# Patient Record
Sex: Female | Born: 1990 | Race: White | Hispanic: No | Marital: Married | Smoking: Never smoker
Health system: Southern US, Community
[De-identification: ages and names within clinical notes are randomized; demographics above are authoritative.]

## PROBLEM LIST (undated history)

## (undated) HISTORY — PX: WISDOM TOOTH EXTRACTION: SHX21

---
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
## 2018-12-02 ENCOUNTER — Other Ambulatory Visit: Payer: Self-pay | Admitting: *Deleted

## 2018-12-02 DIAGNOSIS — Z20822 Contact with and (suspected) exposure to covid-19: Secondary | ICD-10-CM

## 2018-12-04 LAB — NOVEL CORONAVIRUS, NAA: SARS-CoV-2, NAA: NOT DETECTED

## 2019-03-05 ENCOUNTER — Ambulatory Visit: Payer: Self-pay | Admitting: Obstetrics and Gynecology

## 2019-03-24 ENCOUNTER — Other Ambulatory Visit: Payer: Self-pay

## 2019-03-24 ENCOUNTER — Encounter: Payer: Self-pay | Admitting: Obstetrics and Gynecology

## 2019-03-24 ENCOUNTER — Other Ambulatory Visit (HOSPITAL_COMMUNITY)
Admission: RE | Admit: 2019-03-24 | Discharge: 2019-03-24 | Disposition: A | Discharge status: Home / Self Care | Payer: 59 | Source: Ambulatory Visit | Attending: Obstetrics and Gynecology | Admitting: Obstetrics and Gynecology

## 2019-03-24 ENCOUNTER — Ambulatory Visit (INDEPENDENT_AMBULATORY_CARE_PROVIDER_SITE_OTHER): Payer: 59 | Admitting: Obstetrics and Gynecology

## 2019-03-24 VITALS — BP 120/82 | Ht 63.0 in | Wt 167.0 lb

## 2019-03-24 DIAGNOSIS — Z113 Encounter for screening for infections with a predominantly sexual mode of transmission: Secondary | ICD-10-CM | POA: Insufficient documentation

## 2019-03-24 DIAGNOSIS — E88819 Insulin resistance, unspecified: Secondary | ICD-10-CM

## 2019-03-24 DIAGNOSIS — Z130 Encounter for screening for diseases of the blood and blood-forming organs and certain disorders involving the immune mechanism: Secondary | ICD-10-CM

## 2019-03-24 DIAGNOSIS — Z124 Encounter for screening for malignant neoplasm of cervix: Secondary | ICD-10-CM | POA: Diagnosis present

## 2019-03-24 DIAGNOSIS — R7989 Other specified abnormal findings of blood chemistry: Secondary | ICD-10-CM | POA: Diagnosis not present

## 2019-03-24 DIAGNOSIS — E8881 Metabolic syndrome: Secondary | ICD-10-CM | POA: Diagnosis not present

## 2019-03-24 DIAGNOSIS — Z01411 Encounter for gynecological examination (general) (routine) with abnormal findings: Secondary | ICD-10-CM | POA: Diagnosis not present

## 2019-03-24 DIAGNOSIS — Z0000 Encounter for general adult medical examination without abnormal findings: Secondary | ICD-10-CM

## 2019-03-24 DIAGNOSIS — Z1329 Encounter for screening for other suspected endocrine disorder: Secondary | ICD-10-CM

## 2019-03-24 DIAGNOSIS — Z23 Encounter for immunization: Secondary | ICD-10-CM | POA: Diagnosis not present

## 2019-03-24 NOTE — Progress Notes (Signed)
Gynecology Annual Exam   PCP: Patient, No Pcp Per  Chief Complaint:  Chief Complaint  Patient presents with  . Gynecologic Exam    History of Present Illness: Patient is a 29 y.o. G0P0000 presents for annual exam. The patient has no complaints today.   LMP: Patient's last menstrual period was 02/20/2019 (exact date). Average Interval: regular, 28 days Duration of flow: 7 days Moderate to heavy blood flow Clots: no Intermenstrual Bleeding: no Postcoital Bleeding: no Dysmenorrhea: no  The patient is sexually active. She currently uses none for contraception. She denies dyspareunia.  The patient does perform self breast exams.  There is no notable family history of breast or ovarian cancer in her family.  The patient wears seatbelts: no.   The patient has regular exercise: yes.    The patient denies current symptoms of depression.    She reports that in in February March 2020 she gained 50 pounds without explanation or change in diet and amount of exercise.  In November 2020 she had blood work which was suggestive of PCOS.  She says that she her testosterone was elevated.  She had not had a pelvic ultrasound.  She is not having irregular menstrual cycles.  She was started on Trulicity which help her with losing weight.  She reports she has lost about 17 pounds since using Trulicity.  She stopped this medication about a month ago.  She denies acne or symptoms of excessive hair growth.   Review of Systems: Review of Systems  Constitutional: Negative for chills, fever, malaise/fatigue and weight loss.  HENT: Negative for congestion, hearing loss and sinus pain.   Eyes: Negative for blurred vision and double vision.  Respiratory: Negative for cough, sputum production, shortness of breath and wheezing.   Cardiovascular: Negative for chest pain, palpitations, orthopnea and leg swelling.  Gastrointestinal: Negative for abdominal pain, constipation, diarrhea, nausea and vomiting.    Genitourinary: Negative for dysuria, flank pain, frequency, hematuria and urgency.  Musculoskeletal: Negative for back pain, falls and joint pain.  Skin: Negative for itching and rash.  Neurological: Negative for dizziness and headaches.  Psychiatric/Behavioral: Negative for depression, substance abuse and suicidal ideas. The patient is not nervous/anxious.     Past Medical History:  There are no problems to display for this patient.   Past Surgical History:  History reviewed. No pertinent surgical history.  Gynecologic History:  Patient's last menstrual period was 02/20/2019 (exact date). Contraception: none Last Pap: Results were: 2015, see care everywhere   Obstetric History: G0P0000  Family History:  Family History  Problem Relation Age of Onset  . Bladder Cancer Father     Social History:  Social History   Socioeconomic History  . Marital status: Married    Spouse name: Not on file  . Number of children: Not on file  . Years of education: Not on file  . Highest education level: Not on file  Occupational History  . Not on file  Tobacco Use  . Smoking status: Never Smoker  . Smokeless tobacco: Never Used  Substance and Sexual Activity  . Alcohol use: Not Currently  . Drug use: Never  . Sexual activity: Yes    Birth control/protection: None  Other Topics Concern  . Not on file  Social History Narrative  . Not on file   Social Determinants of Health   Financial Resource Strain:   . Difficulty of Paying Living Expenses: Not on file  Food Insecurity:   . Worried About  Running Out of Food in the Last Year: Not on file  . Ran Out of Food in the Last Year: Not on file  Transportation Needs:   . Lack of Transportation (Medical): Not on file  . Lack of Transportation (Non-Medical): Not on file  Physical Activity:   . Days of Exercise per Week: Not on file  . Minutes of Exercise per Session: Not on file  Stress:   . Feeling of Stress : Not on file  Social  Connections:   . Frequency of Communication with Friends and Family: Not on file  . Frequency of Social Gatherings with Friends and Family: Not on file  . Attends Religious Services: Not on file  . Active Member of Clubs or Organizations: Not on file  . Attends Archivist Meetings: Not on file  . Marital Status: Not on file  Intimate Partner Violence:   . Fear of Current or Ex-Partner: Not on file  . Emotionally Abused: Not on file  . Physically Abused: Not on file  . Sexually Abused: Not on file    Allergies:  No Known Allergies  Medications: Prior to Admission medications   Not on File    Physical Exam Vitals: Blood pressure 120/82, height 5\' 3"  (1.6 m), weight 167 lb (75.8 kg), last menstrual period 02/20/2019.  General: NAD HEENT: normocephalic, anicteric Thyroid: no enlargement, no palpable nodules Pulmonary: No increased work of breathing, CTAB Cardiovascular: RRR, distal pulses 2+ Breast: Breast symmetrical, no tenderness, no palpable nodules or masses, no skin or nipple retraction present, no nipple discharge.  No axillary or supraclavicular lymphadenopathy. Abdomen: NABS, soft, non-tender, non-distended.  Umbilicus without lesions.  No hepatomegaly, splenomegaly or masses palpable. No evidence of hernia  Genitourinary:  External: Normal external female genitalia.  Normal urethral meatus, normal Bartholin's and Skene's glands.    Vagina: Normal vaginal mucosa, no evidence of prolapse.    Cervix: Grossly normal in appearance, no bleeding  Uterus: Non-enlarged, mobile, normal contour.  No CMT  Adnexa: ovaries non-enlarged, no adnexal masses  Rectal: deferred  Lymphatic: no evidence of inguinal lymphadenopathy Extremities: no edema, erythema, or tenderness Neurologic: Grossly intact Psychiatric: mood appropriate, affect full  Female chaperone present for pelvic and breast  portions of the physical exam   Assessment: 29 y.o. G0P0000 routine annual  exam  Plan: Problem List Items Addressed This Visit    None    Visit Diagnoses    Healthcare maintenance    -  Primary   Relevant Orders   CBC   TSH + free T4   Testosterone, Free, Total, SHBG   Hemoglobin A1c   Cervical cancer screening       Relevant Orders   Cytology - PAP   Screening examination for STD (sexually transmitted disease)       Relevant Orders   Cytology - PAP   Screening, anemia, deficiency, iron       Relevant Orders   CBC   Screening for thyroid disorder       Relevant Orders   Testosterone, Free, Total, SHBG   Elevated testosterone level       Relevant Orders   Testosterone, Free, Total, SHBG   Insulin resistance       Relevant Orders   Testosterone, Free, Total, SHBG   Hemoglobin A1c   Need for HPV vaccine       Relevant Orders   HPV 9-valent vaccine,Recombinat (Completed)      2) STI screening  was offered and accepted  2)  ASCCP guidelines and rational discussed.  Patient opts for every 3 years screening interval  3) Contraception - the patient is currently using  none.  She is attempting to conceive in the near future  4) Routine healthcare maintenance including cholesterol, diabetes screening discussed Ordered today  5) Gardasil vaccination. Hx of one quadrivalent shot, needs follow up to complete series, will vaccinate today and again in 2 and 6 months.   6) Moderate elevation in GAD-7 score, discussed with patient. She feels like this has been her personality for many years and she has good coping mechanisms for anxiety.   7)  Return in about 2 months (around 05/24/2019) for Nurse visit for gardasil .  Adrian Prows MD [facility] 03/24/2019 4:38 PM

## 2019-03-24 NOTE — Patient Instructions (Signed)
Institute of Medicine Recommended Dietary Allowances for Calcium and Vitamin D  Age (yr) Calcium Recommended Dietary Allowance (mg/day) Vitamin D Recommended Dietary Allowance (international units/day)  9-18 1,300 600  19-50 1,000 600  51-70 1,200 600  71 and older 1,200 800  Data from Institute of Medicine. Dietary reference intakes: calcium, vitamin D. [place], [place]: National Academies Press; 2011.      Exercising to Stay Healthy To become healthy and stay healthy, it is recommended that you do moderate-intensity and vigorous-intensity exercise. You can tell that you are exercising at a moderate intensity if your heart starts beating faster and you start breathing faster but can still hold a conversation. You can tell that you are exercising at a vigorous intensity if you are breathing much harder and faster and cannot hold a conversation while exercising. Exercising regularly is important. It has many health benefits, such as:  Improving overall fitness, flexibility, and endurance.  Increasing bone density.  Helping with weight control.  Decreasing body fat.  Increasing muscle strength.  Reducing stress and tension.  Improving overall health. How often should I exercise? Choose an activity that you enjoy, and set realistic goals. Your health care provider can help you make an activity plan that works for you. Exercise regularly as told by your health care provider. This may include:  Doing strength training two times a week, such as: ? Lifting weights. ? Using resistance bands. ? Push-ups. ? Sit-ups. ? Yoga.  Doing a certain intensity of exercise for a given amount of time. Choose from these options: ? A total of 150 minutes of moderate-intensity exercise every week. ? A total of 75 minutes of vigorous-intensity exercise every week. ? A mix of moderate-intensity and vigorous-intensity exercise every week. Children, pregnant women, people who have not  exercised regularly, people who are overweight, and older adults may need to talk with a health care provider about what activities are safe to do. If you have a medical condition, be sure to talk with your health care provider before you start a new exercise program. What are some exercise ideas? Moderate-intensity exercise ideas include:  Walking 1 mile (1.6 km) in about 15 minutes.  Biking.  Hiking.  Golfing.  Dancing.  Water aerobics. Vigorous-intensity exercise ideas include:  Walking 4.5 miles (7.2 km) or more in about 1 hour.  Jogging or running 5 miles (8 km) in about 1 hour.  Biking 10 miles (16.1 km) or more in about 1 hour.  Lap swimming.  Roller-skating or in-line skating.  Cross-country skiing.  Vigorous competitive sports, such as football, basketball, and soccer.  Jumping rope.  Aerobic dancing. What are some everyday activities that can help me to get exercise?  Yard work, such as: ? Pushing a lawn mower. ? Raking and bagging leaves.  Washing your car.  Pushing a stroller.  Shoveling snow.  Gardening.  Washing windows or floors. How can I be more active in my day-to-day activities?  Use stairs instead of an elevator.  Take a walk during your lunch break.  If you drive, park your car farther away from your work or school.  If you take public transportation, get off one stop early and walk the rest of the way.  Stand up or walk around during all of your indoor phone calls.  Get up, stretch, and walk around every 30 minutes throughout the day.  Enjoy exercise with a friend. Support to continue exercising will help you keep a regular routine of activity. What guidelines   can I follow while exercising?  Before you start a new exercise program, talk with your health care provider.  Do not exercise so much that you hurt yourself, feel dizzy, or get very short of breath.  Wear comfortable clothes and wear shoes with good support.  Drink  plenty of water while you exercise to prevent dehydration or heat stroke.  Work out until your breathing and your heartbeat get faster. Where to find more information  [place] Department of Health and Human Services: www.hhs.gov  Centers for Disease Control and Prevention (CDC): www.cdc.gov Summary  Exercising regularly is important. It will improve your overall fitness, flexibility, and endurance.  Regular exercise also will improve your overall health. It can help you control your weight, reduce stress, and improve your bone density.  Do not exercise so much that you hurt yourself, feel dizzy, or get very short of breath.  Before you start a new exercise program, talk with your health care provider. This information is not intended to replace advice given to you by your health care provider. Make sure you discuss any questions you have with your health care provider. Document Revised: 12/15/2016 Document Reviewed: 11/23/2016 Elsevier Patient Education  2020 Elsevier Inc.  Budget-Friendly Healthy Eating There are many ways to save money at the grocery store and continue to eat healthy. You can be successful if you:  Plan meals according to your budget.  Make a grocery list and only purchase food according to your grocery list.  Prepare food yourself. What are tips for following this plan?  Reading food labels  Compare food labels between brand name foods and the store brand. Often the nutritional value is the same, but the store brand is lower cost.  Look for products that do not have added sugar, fat, or salt (sodium). These often cost the same but are healthier for you. Products may be labeled as: ? Sugar-free. ? Nonfat. ? Low-fat. ? Sodium-free. ? Low-sodium.  Look for lean ground beef labeled as at least 92% lean and 8% fat. Shopping  Buy only the items on your grocery list and go only to the areas of the store that have the items on your list.  Use coupons only for  foods and brands you normally buy. Avoid buying items you wouldn't normally buy simply because they are on sale.  Check online and in newspapers for weekly deals.  Buy healthy items from the bulk bins when available, such as herbs, spices, flour, pasta, nuts, and dried fruit.  Buy fruits and vegetables that are in season. Prices are usually lower on in-season produce.  Look at the unit price on the price tag. Use it to compare different brands and sizes to find out which item is the best deal.  Choose healthy items that are often low-cost, such as carrots, potatoes, apples, bananas, and oranges. Dried or canned beans are a low-cost protein source.  Buy in bulk and freeze extra food. Items you can buy in bulk include meats, fish, poultry, frozen fruits, and frozen vegetables.  Avoid buying "ready-to-eat" foods, such as pre-cut fruits and vegetables and pre-made salads.  If possible, shop around to discover where you can find the best prices. Consider other retailers such as dollar stores, larger wholesale stores, local fruit and vegetable stands, and farmers markets.  Do not shop when you are hungry. If you shop while hungry, it may be hard to stick to your list and budget.  Resist impulse buying. Use your grocery list as   your official plan for the week.  Buy a variety of vegetables and fruits by purchasing fresh, frozen, and canned items.  Look at the top and bottom shelves for deals. Foods at eye level (eye level of an adult or child) are usually more expensive.  Be efficient with your time when shopping. The more time you spend at the store, the more money you are likely to spend.  To save money when choosing more expensive foods like meats and dairy: ? Choose cheaper cuts of meat, such as bone-in chicken thighs and drumsticks instead of skinless and boneless chicken. When you are ready to prepare the chicken, you can remove the skin yourself to make it healthier. ? Choose lean meats  like chicken or turkey instead of beef. ? Choose canned seafood, such as tuna, salmon, or sardines. ? Buy eggs as a low-cost source of protein. ? Buy dried beans and peas, such as lentils, split peas, or kidney beans instead of meats. Dried beans and peas are a good alternative source of protein. ? Buy the larger tubs of yogurt instead of individual-sized containers.  Choose water instead of sodas and other sweetened beverages.  Avoid buying chips, cookies, and other "junk food." These items are usually expensive and not healthy. Cooking  Make extra food and freeze the extras in meal-sized containers or in individual portions for fast meals and snacks.  Pre-cook on days when you have extra time to prepare meals in advance. You can keep these meals in the fridge or freezer and reheat for a quick meal.  When you come home from the grocery store, wash, peel, and cut fruits and vegetables so they are ready to use and eat. This will help reduce food waste. Meal planning  Do not eat out or get fast food. Prepare food at home.  Make a grocery list and make sure to bring it with you to the store. If you have a smart phone, you could use your phone to create your shopping list.  Plan meals and snacks according to a grocery list and budget you create.  Use leftovers in your meal plan for the week.  Look for recipes where you can cook once and make enough food for two meals.  Include budget-friendly meals like stews, casseroles, and stir-fry dishes.  Try some meatless meals or try "no cook" meals like salads.  Make sure that half your plate is filled with fruits or vegetables. Choose from fresh, frozen, or canned fruits and vegetables. If eating canned, remember to rinse them before eating. This will remove any excess salt added for packaging. Summary  Eating healthy on a budget is possible if you plan your meals according to your budget, purchase according to your budget and grocery list,  and prepare food yourself.  Tips for buying more food on a limited budget include buying generic brands, using coupons only for foods you normally buy, and buying healthy items from the bulk bins when available.  Tips for buying cheaper food to replace expensive food include choosing cheaper, lean cuts of meat, and buying dried beans and peas. This information is not intended to replace advice given to you by your health care provider. Make sure you discuss any questions you have with your health care provider. Document Revised: 01/03/2017 Document Reviewed: 01/03/2017 Elsevier Patient Education  2020 Elsevier Inc.  

## 2019-03-25 ENCOUNTER — Other Ambulatory Visit: Payer: Self-pay | Admitting: Obstetrics and Gynecology

## 2019-03-25 DIAGNOSIS — F411 Generalized anxiety disorder: Secondary | ICD-10-CM

## 2019-03-25 MED ORDER — ESCITALOPRAM OXALATE 10 MG PO TABS: 10.0000 mg | ORAL_TABLET | Freq: Every day | ORAL | 3 refills | Status: DC

## 2019-03-26 LAB — CYTOLOGY - PAP
Chlamydia: NEGATIVE
Comment: NEGATIVE
Comment: NEGATIVE
Comment: NORMAL
Diagnosis: NEGATIVE
Neisseria Gonorrhea: NEGATIVE
Trichomonas: NEGATIVE

## 2019-03-27 NOTE — Progress Notes (Signed)
Please call with normal pap result.

## 2019-03-28 ENCOUNTER — Telehealth: Payer: Self-pay | Admitting: Obstetrics and Gynecology

## 2019-03-28 LAB — TSH+FREE T4
Free T4: 1.22 ng/dL (ref 0.82–1.77)
TSH: 2.47 u[IU]/mL (ref 0.450–4.500)

## 2019-03-28 LAB — TESTOSTERONE, FREE, TOTAL, SHBG
Sex Hormone Binding: 38.3 nmol/L (ref 24.6–122.0)
Testosterone, Free: 3.3 pg/mL (ref 0.0–4.2)
Testosterone: 54 ng/dL — ABNORMAL HIGH (ref 8–48)

## 2019-03-28 LAB — CBC
Hematocrit: 38.7 % (ref 34.0–46.6)
Hemoglobin: 12.4 g/dL (ref 11.1–15.9)
MCH: 29.7 pg (ref 26.6–33.0)
MCHC: 32 g/dL (ref 31.5–35.7)
MCV: 93 fL (ref 79–97)
Platelets: 232 10*3/uL (ref 150–450)
RBC: 4.17 x10E6/uL (ref 3.77–5.28)
RDW: 13.2 % (ref 11.7–15.4)
WBC: 7.1 10*3/uL (ref 3.4–10.8)

## 2019-03-28 LAB — HEMOGLOBIN A1C
Est. average glucose Bld gHb Est-mCnc: 91 mg/dL
Hgb A1c MFr Bld: 4.8 % (ref 4.8–5.6)

## 2019-03-28 NOTE — Telephone Encounter (Signed)
Patient is calling for labs results. Please advise. 

## 2019-05-05 ENCOUNTER — Ambulatory Visit (INDEPENDENT_AMBULATORY_CARE_PROVIDER_SITE_OTHER): Payer: 59 | Admitting: Obstetrics and Gynecology

## 2019-05-05 ENCOUNTER — Encounter: Payer: Self-pay | Admitting: Obstetrics and Gynecology

## 2019-05-05 ENCOUNTER — Other Ambulatory Visit: Payer: Self-pay

## 2019-05-05 DIAGNOSIS — F411 Generalized anxiety disorder: Secondary | ICD-10-CM | POA: Diagnosis not present

## 2019-05-05 NOTE — Progress Notes (Signed)
Virtual Visit via Telephone Note  I connected with Valerie Roberts on 05/05/19 at  4:10 PM EDT by telephone and verified that I am speaking with the correct person using two identifiers.   I discussed the limitations, risks, security and privacy concerns of performing an evaluation and management service by telephone and the availability of in person appointments. I also discussed with the patient that there may be a patient responsible charge related to this service. The patient expressed understanding and agreed to proceed.  The patient was at home I spoke with the patient from my  office The names of people involved in this encounter were: Dr. Gilman Schmidt and Valerie Roberts.   History of Present Illness: She has been taking the antidepressant and is feeling much better. She has noticed that she has an easier time falling asleep at night, which is a welcomed change. She would like to continue taking the medication at this time.   GAD-7 has improved to a 2.   Observations/Objective:  Physical Exam could not be performed. Because of the COVID-19 outbreak this visit was performed over the phone and not in person.   Assessment and Plan: 29 yo with moderate anxiety Symptoms improved with Lexapro. Will plan to continue medication. Will need another refill in about 3 months.   Follow Up Instructions: 1 month for a gardasil vaccination   I discussed the assessment and treatment plan with the patient. The patient was provided an opportunity to ask questions and all were answered. The patient agreed with the plan and demonstrated an understanding of the instructions.   The patient was advised to call back or seek an in-person evaluation if the symptoms worsen or if the condition fails to improve as anticipated.  I provided 15 minutes of non-face-to-face time during this encounter.  Adrian Prows MD [facility] 05/05/2019 4:49 PM

## 2019-05-22 ENCOUNTER — Other Ambulatory Visit: Payer: Self-pay | Admitting: Obstetrics and Gynecology

## 2019-05-22 DIAGNOSIS — F411 Generalized anxiety disorder: Secondary | ICD-10-CM

## 2019-07-24 ENCOUNTER — Ambulatory Visit: Payer: 59

## 2019-08-17 ENCOUNTER — Other Ambulatory Visit: Payer: Self-pay | Admitting: Obstetrics and Gynecology

## 2019-08-17 DIAGNOSIS — F411 Generalized anxiety disorder: Secondary | ICD-10-CM

## 2019-09-04 ENCOUNTER — Ambulatory Visit (INDEPENDENT_AMBULATORY_CARE_PROVIDER_SITE_OTHER): Payer: 59 | Admitting: Dermatology

## 2019-09-04 ENCOUNTER — Other Ambulatory Visit: Payer: Self-pay

## 2019-09-04 DIAGNOSIS — Z1283 Encounter for screening for malignant neoplasm of skin: Secondary | ICD-10-CM | POA: Diagnosis not present

## 2019-09-04 DIAGNOSIS — D1800 Hemangioma unspecified site: Secondary | ICD-10-CM

## 2019-09-04 DIAGNOSIS — D229 Melanocytic nevi, unspecified: Secondary | ICD-10-CM | POA: Diagnosis not present

## 2019-09-04 DIAGNOSIS — L814 Other melanin hyperpigmentation: Secondary | ICD-10-CM

## 2019-09-04 DIAGNOSIS — B078 Other viral warts: Secondary | ICD-10-CM | POA: Diagnosis not present

## 2019-09-04 DIAGNOSIS — L578 Other skin changes due to chronic exposure to nonionizing radiation: Secondary | ICD-10-CM

## 2019-09-04 NOTE — Progress Notes (Signed)
   New Patient Visit  Subjective  Valerie Roberts is a 29 y.o. female who presents for the following: Lesions  Patient is here today to have 2 spots checked and also have skin cancer screening.  Patient has a spot on right thigh, sometimes it is raised but is always white. Present for > 1 year. No treatment.   She has a spot behind right here, not sure how long it's been there. Asymptomatic for patient.   No personal history of skin cancer, unsure if there is a family history.   The following portions of the chart were reviewed this encounter and updated as appropriate:  Tobacco  Allergies  Meds  Problems  Med Hx  Surg Hx  Fam Hx      Review of Systems:  No other skin or systemic complaints except as noted in HPI or Assessment and Plan.  Objective  Well appearing patient in no apparent distress; mood and affect are within normal limits.  A full examination was performed including scalp, head, eyes, ears, nose, lips, neck, chest, axillae, abdomen, back, buttocks, bilateral upper extremities, bilateral lower extremities, hands, feet, fingers, toes, fingernails, and toenails. All findings within normal limits unless otherwise noted below.  Objective  Right Thigh - Anterior: Light pink slightly verrucous papule   Assessment & Plan  Other viral warts Right Thigh - Anterior  Flat wart/verruca plana vs SK. Discussed viral etiology and risk of spread.  Discussed multiple treatments may be required to clear warts.  Discussed possible post-treatment dyspigmentation and risk of recurrence.  Treatment deferred. Benign-appearing.  Observation.  Call clinic for new or changing lesions.  Recommend daily use of broad spectrum spf 30+ sunscreen to sun-exposed areas.      Lentigines - Scattered tan macules - Discussed due to sun exposure - Benign, observe - Call for any changes  Melanocytic Nevi - Tan-brown and/or pink-flesh-colored symmetric macules and papules - Benign appearing  on exam today - Observation - Call clinic for new or changing moles - Recommend daily use of broad spectrum spf 30+ sunscreen to sun-exposed areas.   Hemangiomas - Red papules, one at right posterior ear - Discussed benign nature - Observe - Call for any changes  Actinic Damage - diffuse scaly erythematous macules with underlying dyspigmentation - Recommend daily broad spectrum sunscreen SPF 30+ to sun-exposed areas, reapply every 2 hours as needed.  - Call for new or changing lesions.  Skin cancer screening performed today.   Return if symptoms worsen or fail to improve.  Graciella Belton, RMA, am acting as scribe for Forest Gleason, MD .  Documentation: I have reviewed the above documentation for accuracy and completeness, and I agree with the above.  Forest Gleason, MD

## 2019-09-04 NOTE — Patient Instructions (Addendum)
Melanoma ABCDEs  Melanoma is the most dangerous type of skin cancer, and is the leading cause of death from skin disease.  You are more likely to develop melanoma if you:  Have light-colored skin, light-colored eyes, or red or blond hair  Spend a lot of time in the sun  Tan regularly, either outdoors or in a tanning bed  Have had blistering sunburns, especially during childhood  Have a close family member who has had a melanoma  Have atypical moles or large birthmarks  Early detection of melanoma is key since treatment is typically straightforward and cure rates are extremely high if we catch it early.   The first sign of melanoma is often a change in a mole or a new dark spot.  The ABCDE system is a way of remembering the signs of melanoma.  A for asymmetry:  The two halves do not match. B for border:  The edges of the growth are irregular. C for color:  A mixture of colors are present instead of an even brown color. D for diameter:  Melanomas are usually (but not always) greater than 6mm - the size of a pencil eraser. E for evolution:  The spot keeps changing in size, shape, and color.  Please check your skin once per month between visits. You can use a small mirror in front and a large mirror behind you to keep an eye on the back side or your body.   If you see any new or changing lesions before your next follow-up, please call to schedule a visit.  Please continue daily skin protection including broad spectrum sunscreen SPF 30+ to sun-exposed areas, reapplying every 2 hours as needed when you're outdoors.    Recommend taking Heliocare sun protection supplement daily in sunny weather for additional sun protection. For maximum protection on the sunniest days, you can take up to 2 capsules of regular Heliocare OR take 1 capsule of Heliocare Ultra. For prolonged exposure (such as a full day in the sun), you can repeat your dose of the supplement 4 hours after your first dose.  Heliocare can be purchased at [facility] or at [email or web address].   

## 2019-09-15 ENCOUNTER — Encounter: Payer: Self-pay | Admitting: Dermatology

## 2019-09-30 ENCOUNTER — Ambulatory Visit (INDEPENDENT_AMBULATORY_CARE_PROVIDER_SITE_OTHER): Payer: 59

## 2019-09-30 ENCOUNTER — Other Ambulatory Visit: Payer: Self-pay

## 2019-09-30 ENCOUNTER — Other Ambulatory Visit: Payer: Self-pay | Admitting: Obstetrics and Gynecology

## 2019-09-30 DIAGNOSIS — R102 Pelvic and perineal pain: Secondary | ICD-10-CM

## 2019-09-30 NOTE — Telephone Encounter (Signed)
Please schedule patient for a visit and pelvic US tomorrow or later today if available. Thank you.

## 2019-09-30 NOTE — Telephone Encounter (Signed)
Patient is scheduled this afternoon at 4:30 for ultrasound and 8:50 am tomorrow to follow up. Please place ultrasound order. Thank you!

## 2019-10-01 ENCOUNTER — Ambulatory Visit (INDEPENDENT_AMBULATORY_CARE_PROVIDER_SITE_OTHER): Payer: 59 | Admitting: Obstetrics and Gynecology

## 2019-10-01 ENCOUNTER — Encounter: Payer: Self-pay | Admitting: Obstetrics and Gynecology

## 2019-10-01 VITALS — BP 114/72 | HR 84 | Resp 16 | Ht 63.0 in | Wt 174.0 lb

## 2019-10-01 DIAGNOSIS — N83202 Unspecified ovarian cyst, left side: Secondary | ICD-10-CM | POA: Diagnosis not present

## 2019-10-01 DIAGNOSIS — R102 Pelvic and perineal pain: Secondary | ICD-10-CM

## 2019-10-01 NOTE — Patient Instructions (Signed)
Preparing for Pregnancy If you are considering becoming pregnant, make an appointment to see your regular health care provider to learn how to prepare for a safe and healthy pregnancy (preconception care). During a preconception care visit, your health care provider will:  Do a complete physical exam, including a Pap test.  Take a complete medical history.  Give you information, answer your questions, and help you resolve problems. Preconception checklist Medical history  Tell your health care provider about any current or past medical conditions. Your pregnancy or your ability to become pregnant may be affected by chronic conditions, such as diabetes, chronic hypertension, and thyroid problems.  Include your family's medical history as well as your partner's medical history.  Tell your health care provider about any history of STIs (sexually transmitted infections).These can affect your pregnancy. In some cases, they can be passed to your baby. Discuss any concerns that you have about STIs.  If indicated, discuss the benefits of genetic testing. This testing will show whether there are any genetic conditions that may be passed from you or your partner to your baby.  Tell your health care provider about: ? Any problems you have had with conception or pregnancy. ? Any medicines you take. These include vitamins, herbal supplements, and over-the-counter medicines. ? Your history of immunizations. Discuss any vaccinations that you may need. Diet  Ask your health care provider what to include in a healthy diet that has a balance of nutrients. This is especially important when you are pregnant or preparing to become pregnant.  Ask your health care provider to help you reach a healthy weight before pregnancy. ? If you are overweight, you may be at higher risk for certain complications, such as high blood pressure, diabetes, and preterm birth. ? If you are underweight, you are more likely to  have a baby who has a low birth weight. Lifestyle, work, and home  Let your health care provider know: ? About any lifestyle habits that you have, such as alcohol use, drug use, or smoking. ? About recreational activities that may put you at risk during pregnancy, such as downhill skiing and certain exercise programs. ? Tell your health care provider about any international travel, especially any travel to places with an active Zika virus outbreak. ? About harmful substances that you may be exposed to at work or at home. These include chemicals, pesticides, radiation, or even litter boxes. ? If you do not feel safe at home. Mental health  Tell your health care provider about: ? Any history of mental health conditions, including feelings of depression, sadness, or anxiety. ? Any medicines that you take for a mental health condition. These include herbs and supplements. Home instructions to prepare for pregnancy Lifestyle   Eat a balanced diet. This includes fresh fruits and vegetables, whole grains, lean meats, low-fat dairy products, healthy fats, and foods that are high in fiber. Ask to meet with a nutritionist or registered dietitian for assistance with meal planning and goals.  Get regular exercise. Try to be active for at least 30 minutes a day on most days of the week. Ask your health care provider which activities are safe during pregnancy.  Do not use any products that contain nicotine or tobacco, such as cigarettes and e-cigarettes. If you need help quitting, ask your health care provider.  Do not drink alcohol.  Do not take illegal drugs.  Maintain a healthy weight. Ask your health care provider what weight range is right for you. General   instructions  Keep an accurate record of your menstrual periods. This makes it easier for your health care provider to determine your baby's due date.  Begin taking prenatal vitamins and folic acid supplements daily as directed by your  health care provider.  Manage any chronic conditions, such as high blood pressure and diabetes, as told by your health care provider. This is important. How do I know that I am pregnant? You may be pregnant if you have been sexually active and you miss your period. Symptoms of early pregnancy include:  Mild cramping.  Very light vaginal bleeding (spotting).  Feeling unusually tired.  Nausea and vomiting (morning sickness). If you have any of these symptoms and you suspect that you might be pregnant, you can take a home pregnancy test. These tests check for a hormone in your urine (human chorionic gonadotropin, or hCG). A woman's body begins to make this hormone during early pregnancy. These tests are very accurate. Wait until at least the first day after you miss your period to take one. If the test shows that you are pregnant (you get a positive result), call your health care provider to make an appointment for prenatal care. What should I do if I become pregnant?      Make an appointment with your health care provider as soon as you suspect you are pregnant.  Do not use any products that contain nicotine, such as cigarettes, chewing tobacco, and e-cigarettes. If you need help quitting, ask your health care provider.  Do not drink alcoholic beverages. Alcohol is related to a number of birth defects.  Avoid toxic odors and chemicals.  You may continue to have sexual intercourse if it does not cause pain or other problems, such as vaginal bleeding. This information is not intended to replace advice given to you by your health care provider. Make sure you discuss any questions you have with your health care provider. Document Revised: 01/04/2017 Document Reviewed: 07/25/2015 Elsevier Patient Education  [place]. Ovarian Cyst     An ovarian cyst is a fluid-filled sac that forms on an ovary. The ovaries are small organs that produce eggs in women. Various types of cysts can  form on the ovaries. Some may cause symptoms and require treatment. Most ovarian cysts go away on their own, are not cancerous (are benign), and do not cause problems. Common types of ovarian cysts include:  Functional (follicle) cysts. ? Occur during the menstrual cycle, and usually go away with the next menstrual cycle if you do not get pregnant. ? Usually cause no symptoms.  Endometriomas. ? Are cysts that form from the tissue that lines the uterus (endometrium). ? Are sometimes called "chocolate cysts" because they become filled with blood that turns brown. ? Can cause pain in the lower abdomen during intercourse and during your period.  Cystadenoma cysts. ? Develop from cells on the outside surface of the ovary. ? Can get very large and cause lower abdomen pain and pain with intercourse. ? Can cause severe pain if they twist or break open (rupture).  Dermoid cysts. ? Are sometimes found in both ovaries. ? May contain different kinds of body tissue, such as skin, teeth, hair, or cartilage. ? Usually do not cause symptoms unless they get very big.  Theca lutein cysts. ? Occur when too much of a certain hormone (human chorionic gonadotropin) is produced and overstimulates the ovaries to produce an egg. ? Are most common after having procedures used to assist with the  conception of a baby (in vitro fertilization). What are the causes? Ovarian cysts may be caused by:  Ovarian hyperstimulation syndrome. This is a condition that can develop from taking fertility medicines. It causes multiple large ovarian cysts to form.  Polycystic ovarian syndrome (PCOS). This is a common hormonal disorder that can cause ovarian cysts, as well as problems with your period or fertility. What increases the risk? The following factors may make you more likely to develop ovarian cysts:  Being overweight or obese.  Taking fertility medicines.  Taking certain forms of hormonal birth  control.  Smoking. What are the signs or symptoms? Many ovarian cysts do not cause symptoms. If symptoms are present, they may include:  Pelvic pain or pressure.  Pain in the lower abdomen.  Pain during sex.  Abdominal swelling.  Abnormal menstrual periods.  Increasing pain with menstrual periods. How is this diagnosed? These cysts are commonly found during a routine pelvic exam. You may have tests to find out more about the cyst, such as:  Ultrasound.  X-ray of the pelvis.  CT scan.  MRI.  Blood tests. How is this treated? Many ovarian cysts go away on their own without treatment. Your health care provider may want to check your cyst regularly for 2-3 months to see if it changes. If you are in menopause, it is especially important to have your cyst monitored closely because menopausal women have a higher rate of ovarian cancer. When treatment is needed, it may include:  Medicines to help relieve pain.  A procedure to drain the cyst (aspiration).  Surgery to remove the whole cyst.  Hormone treatment or birth control pills. These methods are sometimes used to help dissolve a cyst. Follow these instructions at home:  Take over-the-counter and prescription medicines only as told by your health care provider.  Do not drive or use heavy machinery while taking prescription pain medicine.  Get regular pelvic exams and Pap tests as often as told by your health care provider.  Return to your normal activities as told by your health care provider. Ask your health care provider what activities are safe for you.  Do not use any products that contain nicotine or tobacco, such as cigarettes and e-cigarettes. If you need help quitting, ask your health care provider.  Keep all follow-up visits as told by your health care provider. This is important. Contact a health care provider if:  Your periods are late, irregular, or painful, or they stop.  You have pelvic pain that does  not go away.  You have pressure on your bladder or trouble emptying your bladder completely.  You have pain during sex.  You have any of the following in your abdomen: ? A feeling of fullness. ? Pressure. ? Discomfort. ? Pain that does not go away. ? Swelling.  You feel generally ill.  You become constipated.  You lose your appetite.  You develop severe acne.  You start to have more body hair and facial hair.  You are gaining weight or losing weight without changing your exercise and eating habits.  You think you may be pregnant. Get help right away if:  You have abdominal pain that is severe or gets worse.  You cannot eat or drink without vomiting.  You suddenly develop a fever.  Your menstrual period is much heavier than usual. This information is not intended to replace advice given to you by your health care provider. Make sure you discuss any questions you have with your  health care provider. Document Revised: 04/02/2017 Document Reviewed: 06/06/2015 Elsevier Patient Education  2020 [employer].

## 2019-10-01 NOTE — Progress Notes (Signed)
Patient ID: Valerie Roberts, female   DOB: 1990-05-02, 29 y.o.   MRN: [id number]  Reason for Consult: Gynecologic Exam ([place])   Referred by No ref. provider found  Subjective:     HPI:  Valerie Roberts is a 29 y.o. female. She is following up today for severe left sided pain which started Saturday. On Sunday the pain kept her in bed. Since she sent a mychart message her pain has improved. She feels fine today. She tolerated the pelvic US.  She has been trying to conceive for 4 months. Her period started today. She reports regular monthly periods.   She started her HPV vaccine but did not follow up to complete immunization. She does not want to complete vaccination.    No past medical history on file. Family History  Problem Relation Age of Onset  . Bladder Cancer Father    No past surgical history on file.  Short Social History:  Social History   Tobacco Use  . Smoking status: Never Smoker  . Smokeless tobacco: Never Used  Substance Use Topics  . Alcohol use: Not Currently    No Known Allergies  No current outpatient medications on file.   No current facility-administered medications for this visit.    Review of Systems  Constitutional: Negative for chills, fatigue, fever and unexpected weight change.  HENT: Negative for trouble swallowing.  Eyes: Negative for loss of vision.  Respiratory: Negative for cough, shortness of breath and wheezing.  Cardiovascular: Negative for chest pain, leg swelling, palpitations and syncope.  GI: Negative for abdominal pain, blood in stool, diarrhea, nausea and vomiting.  GU: Negative for difficulty urinating, dysuria, frequency and hematuria.  Musculoskeletal: Negative for back pain, leg pain and joint pain.  Skin: Negative for rash.  Neurological: Negative for dizziness, headaches, light-headedness, numbness and seizures.  Psychiatric: Negative for behavioral problem, confusion, depressed mood and sleep disturbance.         Objective:  Objective   Vitals:   10/01/19 0846  BP: 114/72  Pulse: 84  Resp: 16  SpO2: 98%  Weight: 174 lb (78.9 kg)  Height: 5\' 3" (1.6 m)   Body mass index is 30.82 kg/m.  Physical Exam Vitals and nursing note reviewed.  Constitutional:      Appearance: She is well-developed.  HENT:     Head: Normocephalic and atraumatic.  Eyes:     Pupils: Pupils are equal, round, and reactive to light.  Cardiovascular:     Rate and Rhythm: Normal rate and regular rhythm.  Pulmonary:     Effort: Pulmonary effort is normal. No respiratory distress.  Genitourinary:    Comments: Declines pelvic exam Skin:    General: Skin is warm and dry.  Neurological:     Mental Status: She is alert and oriented to person, place, and time.  Psychiatric:        Behavior: Behavior normal.        Thought Content: Thought content normal.        Judgment: Judgment normal.        Assessment/Plan:     29  yo with pelvic pain- improved Complex Left ovarian cyst- likely ovulatory, follow up if pain continues, but would expect resolution of cyst.  Declines to continue HPV vaccination Will check betahcg today to rule out ectopic.  Discussed ovulation and timing of intercourse and use of LH test strips if deired.  Encouraged initiation of Prenatal vitamins.   More than 20  minutes were spent  face to face with the patient in the room, reviewing the medical record, labs and images, and coordinating care for the patient. The plan of management was discussed in detail and counseling was provided.    Adrian Prows MD [facility], [facility] 10/01/2019 9:43 AM

## 2019-10-02 LAB — BETA HCG QUANT (REF LAB): hCG Quant: <1 m[IU]/mL

## 2020-03-22 ENCOUNTER — Ambulatory Visit (INDEPENDENT_AMBULATORY_CARE_PROVIDER_SITE_OTHER): Payer: 59 | Admitting: Obstetrics and Gynecology

## 2020-03-22 ENCOUNTER — Other Ambulatory Visit: Payer: Self-pay

## 2020-03-22 ENCOUNTER — Encounter: Payer: Self-pay | Admitting: Obstetrics and Gynecology

## 2020-03-22 VITALS — BP 116/70 | Ht 63.0 in | Wt 181.4 lb

## 2020-03-22 DIAGNOSIS — N979 Female infertility, unspecified: Secondary | ICD-10-CM

## 2020-03-22 DIAGNOSIS — N839 Noninflammatory disorder of ovary, fallopian tube and broad ligament, unspecified: Secondary | ICD-10-CM | POA: Diagnosis not present

## 2020-03-22 NOTE — Progress Notes (Signed)
Patient ID: Valerie Roberts, female   DOB: 1990/12/27, 30 y.o.   MRN: [id number]  Reason for Consult: Gynecologic Exam   Referred by No ref. provider found  Subjective:     HPI:  Valerie Roberts is a 30 y.o. female . She reports she and her partenr have been trying to conceive for 11 months. It was casual at first, but since September they have been having regular every other day intercourse during her fertile window. She has been tracking ovulation with home ovulation test kits. She generally has positive ovulation results on cycle days 16-20.   They sometimes use lubricants.   Her partner has no history of hernia surgery, urological procedures or radiation to to groin.   Gynecological History  No LMP recorded. Menarche: uncertain  History of fibroids, polyps, or ovarian cysts? : history of ovarian cysts  History of PCOS? no Hstory of Endometriosis? no History of abnormal pap smears? no Have you had any sexually transmitted infections in the past? no  She has had partial HPV vaccination in the past.    Last Pap: Results were: 2021 NIL    She identifies as a female. She is sexually active with men.   She denies dyspareunia. She denies postcoital bleeding.  She currently uses none for contraception.   Obstetrical History G0P0  History reviewed. No pertinent past medical history. Family History  Problem Relation Age of Onset  . Bladder Cancer Father    History reviewed. No pertinent surgical history.  Short Social History:  Social History   Tobacco Use  . Smoking status: Never Smoker  . Smokeless tobacco: Never Used  Substance Use Topics  . Alcohol use: Not Currently    No Known Allergies  No current outpatient medications on file.   No current facility-administered medications for this visit.    Review of Systems  Constitutional: Negative for chills, fatigue, fever and unexpected weight change.  HENT: Negative for trouble swallowing.  Eyes: Negative for  loss of vision.  Respiratory: Negative for cough, shortness of breath and wheezing.  Cardiovascular: Negative for chest pain, leg swelling, palpitations and syncope.  GI: Negative for abdominal pain, blood in stool, diarrhea, nausea and vomiting.  GU: Negative for difficulty urinating, dysuria, frequency and hematuria.  Musculoskeletal: Negative for back pain, leg pain and joint pain.  Skin: Negative for rash.  Neurological: Negative for dizziness, headaches, light-headedness, numbness and seizures.  Psychiatric: Negative for behavioral problem, confusion, depressed mood and sleep disturbance.        Objective:  Objective   Vitals:   03/22/20 1427  BP: 116/70  Weight: 181 lb 6.4 oz (82.3 kg)  Height: 5\' 3"  (1.6 m)   Body mass index is 32.13 kg/m.  Physical Exam Vitals and nursing note reviewed. Exam conducted with a chaperone present.  Constitutional:      Appearance: Normal appearance.  HENT:     Head: Normocephalic and atraumatic.  Eyes:     Extraocular Movements: Extraocular movements intact.     Pupils: Pupils are equal, round, and reactive to light.  Cardiovascular:     Rate and Rhythm: Normal rate and regular rhythm.  Pulmonary:     Effort: Pulmonary effort is normal.     Breath sounds: Normal breath sounds.  Abdominal:     General: Abdomen is flat.     Palpations: Abdomen is soft.  Musculoskeletal:     Cervical back: Normal range of motion.  Skin:    General: Skin is warm and dry.  Neurological:     General: No focal deficit present.     Mental Status: She is alert and oriented to person, place, and time.  Psychiatric:        Behavior: Behavior normal.        Thought Content: Thought content normal.        Judgment: Judgment normal.     Assessment/Plan:     30 yo with difficulty obtaining pregnancy.  She has previously had normal thyroid testing and largely normal pelvic US.  Discussed options for evaluating tubal patency. She will schedule an HSG.   Provided with information on collecting a semen analysis. Order placed.   Will follow up on cycle day 21 for labs. Orders placed for prolactin, progesterone, and AMH.   More than 30 minutes were spent face to face with the patient in the room, reviewing the medical record, labs and images, and coordinating care for the patient. The plan of management was discussed in detail and counseling was provided.     Adrian Prows MD [facility], [facility] 03/22/2020 5:27 PM

## 2020-03-22 NOTE — Patient Instructions (Signed)
Hysterosalpingography  Hysterosalpingography is a procedure to look inside a woman's womb (uterus) and fallopian tubes. During this procedure, contrast dye is injected into the uterus through the vagina and cervix. X-rays are then taken. The dye makes the uterus and fallopian tubes show up clearly on the X-rays and may show whether the tubes are blocked, scarred, or if there are any other abnormalities. This procedure may be done:  To determine if there are tumors, scars, or other abnormalities in the uterus.  To determine why a woman is unable to get pregnant or has had repeated pregnancy losses.  To make sure the fallopian tubes are completely blocked a few months after having certain tubal sterilization procedures. Tell a health care provider about:  Any allergies you have.  All medicines you are taking, including vitamins, herbs, eye drops, creams, and over-the-counter medicines.  Any problems you or family members have had with anesthetic medicines.  Any blood disorders you have.  Any surgeries you have had.  Any medical conditions you have.  Whether you are pregnant or may be pregnant.  Whether you have been diagnosed with an STI (sexually transmitted infection) or you think you have an STI. What are the risks? Generally, this is a safe procedure. However, problems may occur, including:  Infection in the lining of the uterus or fallopian tubes.  Allergic reaction to medicines or dyes.  A hole (perforation) in the uterus or fallopian tubes.  Damage to other structures or organs. What happens before the procedure? Medicines Ask your health care provider about:  Changing or stopping your regular medicines. This is especially important if you are taking diabetes medicines or blood thinners.  Taking medicines such as aspirin and ibuprofen. These medicines can thin your blood. Do not take these medicines unless your health care provider tells you to take them.  Taking  over-the-counter medicines, vitamins, herbs, and supplements. General instructions  Schedule the procedure after your menstrual period stops, but before your next ovulation. This is usually between day 5 and day 10 of your last period. Day 1 is the first day of your period.  Plan to have a responsible adult take you home from the hospital or clinic.  Plan to have a responsible adult care for you for the time you are told after you leave the hospital or clinic. This is important.  Empty your bladder before the procedure begins. What happens during the procedure?  You may be given: ? A medicine to help you relax (sedative). ? A medicine to numb the area (local anesthetic). ? An over-the-counter pain medicine.  You will lie down on your back and place your feet into footrests (stirrups).  A device called a speculum will be inserted into your vagina. This allows your health care provider to see inside your vagina through to your cervix.  Your cervix will be washed with a germ-killing soap.  A medicine may be injected into your cervix to numb it.  A thin, flexible tube will be passed through your cervix into your uterus.  Contrast dye will be passed through the tube and into the uterus. This may cause cramping.  Several X-rays will be taken as the contrast dye spreads through the uterus and into the fallopian tubes.  You may be asked to your change position and roll from side to side if needed.  The tube will be removed. The contrast dye will flow out through your vagina. The procedure may vary among health care providers and hospitals. What  can I expect after procedure?  You may have mild cramping and vaginal bleeding. This should go away after a short time.  Most of the contrast dye will flow out of your vagina. This fluid will be sticky and may have blood in it. You may want to wear a sanitary pad. Do not use a tampon.  You have mild dizziness or nausea. Follow these  instructions at home:  Do not douche, use tampons, or have sex until your health care provider approves.  Do not take baths, swim, or use a hot tub until your health care provider approves. Take showers instead of baths for 2 weeks, or for as long as told by your health care provider.  If you were given a sedative during the procedure, it can affect you for several hours. Do not drive or operate machinery until your health care provider says that it is safe.  Take over-the-counter and prescription medicines only as told by your health care provider.  It is up to you to get the results of your procedure. Ask your health care provider, or the department that is doing the procedure, when your results will be ready.  Keep all follow-up visits. This is important. Contact a health care provider if:  You have a fever or chills.  You faint.  You have severe cramping.  Your skin has a rash, and is itchy or swollen.  You have a bad-smelling vaginal discharge.  You have vaginal bleeding that lasts for more than 4 days. Get help right away if:  You have nausea and vomiting.  You have heavy vaginal bleeding that soaks more than one pad every hour.  You have severe abdominal pain. Summary  Hysterosalpingography is a procedure in which a woman's uterus and fallopian tubes are examined.  During this procedure, contrast dye is injected into the uterus through the vagina and cervix. X-rays are then taken. The dye helps the uterus and fallopian tubes show up clearly on the X-rays.  Schedule the procedure after your menstrual period stops, but before your next ovulation. This is usually between day 5 and day 10 of your last period.  After the procedure, you may have mild cramping and vaginal bleeding. This should go away after a short time.  Do not douche, use tampons, or have sex until your health care provider approves. This information is not intended to replace advice given to you by your  health care provider. Make sure you discuss any questions you have with your health care provider. Document Revised: 09/03/2019 Document Reviewed: 08/20/2019 Elsevier Patient Education  2021 [employer].

## 2020-03-23 ENCOUNTER — Telehealth: Payer: Self-pay

## 2020-03-23 NOTE — Telephone Encounter (Signed)
L/M for pt and adv that HSG has been scheduled at [facility] on 3/15 @ 1:30. Pt to arr at [place] at 1:00 to register.

## 2020-03-23 NOTE — Telephone Encounter (Signed)
-----   Message from Homero Fellers, MD sent at 03/22/2020  3:32 PM EST ----- Regarding: HSG This patient would like to schedule an HSG.Her LMP is 03/22/2019, so if possible she would like to do it this month.   Thank you,  Valerie Roberts

## 2020-03-30 ENCOUNTER — Other Ambulatory Visit: Payer: Self-pay

## 2020-03-30 ENCOUNTER — Ambulatory Visit
Admission: RE | Admit: 2020-03-30 | Discharge: 2020-03-30 | Disposition: A | Discharge status: Home / Self Care | Payer: 59 | Source: Ambulatory Visit | Attending: Obstetrics and Gynecology | Admitting: Obstetrics and Gynecology

## 2020-03-30 DIAGNOSIS — N979 Female infertility, unspecified: Secondary | ICD-10-CM

## 2020-03-30 MED ORDER — IOHEXOL 300 MG/ML  SOLN
50.0000 mL | Freq: Once | INTRAMUSCULAR | Status: AC | PRN
  Administered 2020-03-30: 50 mL

## 2020-04-12 ENCOUNTER — Ambulatory Visit (INDEPENDENT_AMBULATORY_CARE_PROVIDER_SITE_OTHER): Payer: 59 | Admitting: Obstetrics and Gynecology

## 2020-04-12 ENCOUNTER — Encounter: Payer: Self-pay | Admitting: Obstetrics and Gynecology

## 2020-04-12 ENCOUNTER — Other Ambulatory Visit: Payer: Self-pay

## 2020-04-12 DIAGNOSIS — N839 Noninflammatory disorder of ovary, fallopian tube and broad ligament, unspecified: Secondary | ICD-10-CM | POA: Diagnosis not present

## 2020-04-12 DIAGNOSIS — N979 Female infertility, unspecified: Secondary | ICD-10-CM | POA: Diagnosis not present

## 2020-04-12 NOTE — Progress Notes (Signed)
Patient ID: Valerie Roberts, female   DOB: 04-06-1990, 30 y.o.   MRN: [id number]  Reason for Consult: No chief complaint on file.   Referred by No ref. provider found  Subjective:     HPI:  Valerie Roberts is a 30 y.o. female. She is following up today for labs related to infertility evaluation She recently had a HSG which showed open fallopian tubes. She has been doing home ovulation testing which was suggestive of ovulation on cycle day 15  or 16. Her partner has a semen analysis planned for next week on April 5th 2022.    No past medical history on file. Family History  Problem Relation Age of Onset  . Bladder Cancer Father    No past surgical history on file.  Short Social History:  Social History   Tobacco Use  . Smoking status: Never Smoker  . Smokeless tobacco: Never Used  Substance Use Topics  . Alcohol use: Not Currently    No Known Allergies  No current outpatient medications on file.   No current facility-administered medications for this visit.    Review of Systems  Constitutional: Negative for chills, fatigue, fever and unexpected weight change.  HENT: Negative for trouble swallowing.  Eyes: Negative for loss of vision.  Respiratory: Negative for cough, shortness of breath and wheezing.  Cardiovascular: Negative for chest pain, leg swelling, palpitations and syncope.  GI: Negative for abdominal pain, blood in stool, diarrhea, nausea and vomiting.  GU: Negative for difficulty urinating, dysuria, frequency and hematuria.  Musculoskeletal: Negative for back pain, leg pain and joint pain.  Skin: Negative for rash.  Neurological: Negative for dizziness, headaches, light-headedness, numbness and seizures.  Psychiatric: Negative for behavioral problem, confusion, depressed mood and sleep disturbance.        Objective:  Objective   Vitals:   04/12/20 1449  BP: 117/74  Weight: 183 lb 6.4 oz (83.2 kg)  Height: 5\' 3"  (1.6 m)   Body mass index is 32.49  kg/m.  Physical Exam Vitals and nursing note reviewed. Exam conducted with a chaperone present.  Constitutional:      Appearance: Normal appearance.  HENT:     Head: Normocephalic and atraumatic.  Eyes:     Extraocular Movements: Extraocular movements intact.     Pupils: Pupils are equal, round, and reactive to light.  Cardiovascular:     Rate and Rhythm: Normal rate and regular rhythm.  Pulmonary:     Effort: Pulmonary effort is normal.     Breath sounds: Normal breath sounds.  Abdominal:     General: Abdomen is flat.     Palpations: Abdomen is soft.  Musculoskeletal:     Cervical back: Normal range of motion.  Skin:    General: Skin is warm and dry.  Neurological:     General: No focal deficit present.     Mental Status: She is alert and oriented to person, place, and time.  Psychiatric:        Behavior: Behavior normal.        Thought Content: Thought content normal.        Judgment: Judgment normal.     Assessment/Plan:      30 yo here for infertility evaluation and lab testing Discussed next steps. Will complete evaluation and touch base on the phone.  Will consider low level clomid in infertility cause is unknown  More than 5 minutes were spent face to face with the patient in the room, reviewing the medical record, labs  and images, and coordinating care for the patient. The plan of management was discussed in detail and counseling was provided.    Adrian Prows MD [facility] 04/12/2020 3:17 PM

## 2020-04-19 LAB — PROGESTERONE: Progesterone: 12.3 ng/mL

## 2020-04-19 LAB — PROLACTIN: Prolactin: 15.2 ng/mL (ref 4.8–23.3)

## 2020-04-19 LAB — ANTI MULLERIAN HORMONE: ANTI-MULLERIAN HORMONE (AMH): 6.22 ng/mL

## 2020-05-10 ENCOUNTER — Ambulatory Visit (INDEPENDENT_AMBULATORY_CARE_PROVIDER_SITE_OTHER): Payer: 59 | Admitting: Obstetrics and Gynecology

## 2020-05-10 ENCOUNTER — Other Ambulatory Visit: Payer: Self-pay

## 2020-05-10 ENCOUNTER — Encounter: Payer: Self-pay | Admitting: Obstetrics and Gynecology

## 2020-05-10 VITALS — BP 118/70 | Ht 63.0 in | Wt 184.4 lb

## 2020-05-10 DIAGNOSIS — N979 Female infertility, unspecified: Secondary | ICD-10-CM | POA: Diagnosis not present

## 2020-05-10 MED ORDER — LETROZOLE 2.5 MG PO TABS: 2.5000 mg | ORAL_TABLET | Freq: Every day | ORAL | 1 refills | Status: DC

## 2020-05-10 NOTE — Patient Instructions (Signed)
Ovulation Induction Instructions/Schedule: To use with lomiphene citrate (Clomid) or letrozole (Femara)  1. Day 1 of your cycle is the first day of bleeding, whether it happens with a naturally occurring period or is induced with progesterone. Starting on Day 3 of the cycle, take the prescribed medication Letrozole (Femara) for five consecutive days.   2. Starting on Day 9, you should check your urine daily for ovulation with a commercially available ovulation predictor kit. We recommend the Clear Blue Easy kit for all of our patients because it is easy to interpret. It is available at [email or web address] for low cost. There will be a "smiley face" that appears on the day of "surge," which is release of LH (leutenizing hormone). This hormone triggers ovulation. Most women will ovulate, or release an egg, 24 hours after this trigger. Other commercially available kits use a system similar to a pregnancy test kit, with a control line and a test line. When the test line brightness of color is equivalent to that of the control line, the result is positive.   3. Begin timed intercourse on Day 9. Specifically, we recommend that you have intercourse every 36-48 hours for 5 days prior to ovulation and for 5 days after. The most fertile time will be 24 hours after the "smiley face" appears on your kit, usually around Day 14 for women who have a 28 day cycle.   4. If you do not have a "smiley face" or color change on your ovulation predictor kit during your cycle, please contact us. A blood test for progesterone needs to be drawn at approximately Days 22-24 of your cycle. This will help [place] determine whether or not the kit is functioning properly and whether or not you are truly ovulating. It will also help [place] to determine if the dose of your medication needs to adjustment.   5. If no menstrual bleeding has occurred by Day 35, please check a pregnancy test and call us with the results.   6. If you require medication to have a  period, then this will be artificially induced with progesterone for each cycle, and prescribed by your doctor. Take one tablet of medroxyprogesterone acetate, 10 mg, per day for ten days. The bleeding/period should arrive within 1-5 days of finishing the tablets. The first day of bleeding is Day 1.   7. PLEASE NOTE that careful timing is required to use these medications and appropriately time your cycles. We rely on you, the patient, to time your cycles and to keep [place] informed. GET A CALENDAR! There are wonderful apps available for your mobile phone or laptop. One that we like is fertility friend, available at [employer].    18. Occasionally it is unclear whether or not ovulation has occurred. If you are unsure, please call us. Your doctor or one of our nurses will guide you to the next step, which will be either a blood test Day 22-24 for progesterone levels or may involve ultrasound to look for mature follicles (small cysts that can release an egg at the time of ovulation).  9. There is a great book, Taking Charge of Your Fertility, by Desma Paganini, that can help you to understand the hormonal nature of the menstrual cycle. The more knowledge you have about how conception occurs, the better you will understand these prescribed treatments. Knowledge will help you to work with [place] on achieving a conception as efficiently and as quickly as possible!   10. Generally, most patients will use ovulation induction medications  for 6 cycles. If unsuccessful, your doctor will talk to you about moving to the next step.

## 2020-05-10 NOTE — Progress Notes (Signed)
Patient ID: Valerie Roberts, female   DOB: 1990-12-30, 30 y.o.   MRN: [id number]  Reason for Consult: Gynecologic Exam   Referred by No ref. provider found  Subjective:     HPI:  Valerie Roberts is a 30 y.o. female. She is following up today for infertility evaluation. Evaluation thus far largely normal. Partner had elevated WBC on semen analysis, and will be seeing urology soon.   Gynecological History  No LMP recorded. Around 04/29/2019  History of fibroids, polyps, or ovarian cysts? : no  History of PCOS? no Hstory of Endometriosis? no History of abnormal pap smears? no Have you had any sexually transmitted infections in the past? no  Last Pap: Results were: 03/24/2019 NIL   She identifies as a female. She is sexually active with men.   She denies dyspareunia. She denies postcoital bleeding.  She currently uses none for contraception.   Obstetrical History [id number]  No past medical history on file. Family History  Problem Relation Age of Onset  . Bladder Cancer Father    No past surgical history on file.  Short Social History:  Social History   Tobacco Use  . Smoking status: Never Smoker  . Smokeless tobacco: Never Used  Substance Use Topics  . Alcohol use: Not Currently    No Known Allergies  Current Outpatient Medications  Medication Sig Dispense Refill  . letrozole (FEMARA) 2.5 MG tablet Take 1 tablet (2.5 mg total) by mouth daily. Take on days 3 to 7 following a spontaneous menses or progestin-induced bleed. 5 tablet 1   No current facility-administered medications for this visit.    Review of Systems  Constitutional: Negative for chills, fatigue, fever and unexpected weight change.  HENT: Negative for trouble swallowing.  Eyes: Negative for loss of vision.  Respiratory: Negative for cough, shortness of breath and wheezing.  Cardiovascular: Negative for chest pain, leg swelling, palpitations and syncope.  GI: Negative for abdominal pain, blood in stool,  diarrhea, nausea and vomiting.  GU: Negative for difficulty urinating, dysuria, frequency and hematuria.  Musculoskeletal: Negative for back pain, leg pain and joint pain.  Skin: Negative for rash.  Neurological: Negative for dizziness, headaches, light-headedness, numbness and seizures.  Psychiatric: Negative for behavioral problem, confusion, depressed mood and sleep disturbance.        Objective:  Objective   Vitals:   05/10/20 0926  BP: 118/70  Weight: 184 lb 6.4 oz (83.6 kg)  Height: 5\' 3"  (1.6 m)   Body mass index is 32.66 kg/m.  Physical Exam Vitals and nursing note reviewed. Exam conducted with a chaperone present.  Constitutional:      Appearance: Normal appearance.  HENT:     Head: Normocephalic and atraumatic.  Eyes:     Extraocular Movements: Extraocular movements intact.     Pupils: Pupils are equal, round, and reactive to light.  Cardiovascular:     Rate and Rhythm: Normal rate and regular rhythm.  Pulmonary:     Effort: Pulmonary effort is normal.     Breath sounds: Normal breath sounds.  Abdominal:     General: Abdomen is flat.     Palpations: Abdomen is soft.  Musculoskeletal:     Cervical back: Normal range of motion.  Skin:    General: Skin is warm and dry.  Neurological:     General: No focal deficit present.     Mental Status: She is alert and oriented to person, place, and time.  Psychiatric:  Behavior: Behavior normal.        Thought Content: Thought content normal.        Judgment: Judgment normal.     Assessment/Plan:     30 yo with unexplained infertility Would like to start letrozole for ovulation support.  Will start at lowest dose of 2 months then increase if pregnancy is not achieved at 2 month intervals.  Discussed risks of multiple gestation with this medication.  Will plan to communicate via [employer].  Follow up as needed.  More than 5 minutes were spent face to face with the patient in the room, reviewing  the medical record, labs and images, and coordinating care for the patient. The plan of management was discussed in detail and counseling was provided.      Adrian Prows MD [facility] 05/10/2020 10:08 AM

## 2020-07-09 ENCOUNTER — Other Ambulatory Visit: Payer: Self-pay | Admitting: Obstetrics and Gynecology

## 2020-07-09 DIAGNOSIS — N979 Female infertility, unspecified: Secondary | ICD-10-CM

## 2020-07-09 MED ORDER — LETROZOLE 2.5 MG PO TABS: 5.0000 mg | ORAL_TABLET | Freq: Every day | ORAL | 5 refills | Status: DC

## 2020-08-30 ENCOUNTER — Other Ambulatory Visit: Payer: Self-pay | Admitting: Obstetrics and Gynecology

## 2020-08-30 DIAGNOSIS — N979 Female infertility, unspecified: Secondary | ICD-10-CM

## 2020-08-30 DIAGNOSIS — N839 Noninflammatory disorder of ovary, fallopian tube and broad ligament, unspecified: Secondary | ICD-10-CM

## 2020-09-10 ENCOUNTER — Other Ambulatory Visit: Payer: Self-pay | Admitting: Obstetrics and Gynecology

## 2020-09-10 DIAGNOSIS — N979 Female infertility, unspecified: Secondary | ICD-10-CM

## 2020-09-10 MED ORDER — LETROZOLE 2.5 MG PO TABS: 5.0000 mg | ORAL_TABLET | Freq: Every day | ORAL | 5 refills | Status: DC

## 2020-09-15 ENCOUNTER — Other Ambulatory Visit: Payer: Self-pay | Admitting: Obstetrics and Gynecology

## 2020-09-15 DIAGNOSIS — N979 Female infertility, unspecified: Secondary | ICD-10-CM

## 2020-12-01 ENCOUNTER — Ambulatory Visit (INDEPENDENT_AMBULATORY_CARE_PROVIDER_SITE_OTHER): Payer: 59 | Admitting: Dermatology

## 2020-12-01 ENCOUNTER — Other Ambulatory Visit: Payer: Self-pay

## 2020-12-01 DIAGNOSIS — D485 Neoplasm of uncertain behavior of skin: Secondary | ICD-10-CM

## 2020-12-01 DIAGNOSIS — D489 Neoplasm of uncertain behavior, unspecified: Secondary | ICD-10-CM

## 2020-12-01 DIAGNOSIS — D1801 Hemangioma of skin and subcutaneous tissue: Secondary | ICD-10-CM

## 2020-12-01 MED ORDER — MUPIROCIN 2 % EX OINT: 1.0000 "application " | TOPICAL_OINTMENT | Freq: Every day | CUTANEOUS | 0 refills | Status: DC

## 2020-12-01 NOTE — Progress Notes (Signed)
   Follow-Up Visit   Subjective  Valerie Roberts is a 30 y.o. female who presents for the following: Follow-up (Patient here today concerning a spot at right posterior ear. Was examined at patient s last appointment and ruled a hemangioma. Reports it has been present for a while, patient is annoyed at spot and would like removed. ).   The following portions of the chart were reviewed this encounter and updated as appropriate:  Tobacco  Allergies  Meds  Problems  Med Hx  Surg Hx  Fam Hx      Review of Systems: No other skin or systemic complaints except as noted in HPI or Assessment and Plan.   Objective  Well appearing patient in no apparent distress; mood and affect are within normal limits.  A focused examination was performed including right posterior ear. Relevant physical exam findings are noted in the Assessment and Plan.  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 Anesthesia: the lesion was anesthetized in a standard fashion   Anesthetic:  1% lidocaine w/ epinephrine 1-100,000 buffered w/ 8.4% NaHCO3 Instrument used: flexible razor blade   Hemostasis achieved with: pressure, aluminum chloride and electrodesiccation   Outcome: patient tolerated procedure well   Post-procedure details: wound care instructions given   Post-procedure details comment:  Ointment and small bandage applied.  Additional details:  Mupirocin and a bandage applied  mupirocin ointment (BACTROBAN) 2 % Apply 1 application topically daily. Apply to wound at right ear and cover with band-aide until healed  Specimen 1 - Surgical pathology Differential  Diagnosis: r/o irritated hemangioma vs other   Check Margins: No  Symptomatic  R/o irritated hemangioma vs other   Return if symptoms worsen or fail to improve. I, Ruthell Rummage, CMA, am acting as scribe for Forest Gleason, MD.  Documentation: I have reviewed the above documentation for accuracy and completeness, and I agree with the above.  Forest Gleason, MD

## 2020-12-01 NOTE — Patient Instructions (Signed)
Biopsy Wound Care Instructions  Leave the original bandage on for 24 hours if possible.  If the bandage becomes soaked or soiled before that time, it is OK to remove it and examine the wound.  A small amount of post-operative bleeding is normal.  If excessive bleeding occurs, remove the bandage, place gauze over the site and apply continuous pressure (no peeking) over the area for 30 minutes. If this does not work, please call our clinic as soon as possible or page your doctor if it is after hours.   Once a day, cleanse the wound with soap and water. It is fine to shower. If a thick crust develops you may use a Q-tip dipped into dilute hydrogen peroxide (mix 1:1 with water) to dissolve it.  Hydrogen peroxide can slow the healing process, so use it only as needed.    After washing, apply petroleum jelly (Vaseline) or an antibiotic ointment if your doctor prescribed one for you, followed by a bandage.    For best healing, the wound should be covered with a layer of ointment at all times. If you are not able to keep the area covered with a bandage to hold the ointment in place, this may mean re-applying the ointment several times a day.  Continue this wound care until the wound has healed and is no longer open.   Itching and mild discomfort is normal during the healing process. However, if you develop pain or severe itching, please call our office.   If you have any discomfort, you can take Tylenol (acetaminophen) or ibuprofen as directed on the bottle. (Please do not take these if you have an allergy to them or cannot take them for another reason).  Some redness, tenderness and white or yellow material in the wound is normal healing.  If the area becomes very sore and red, or develops a thick yellow-green material (pus), it may be infected; please notify us.    If you have stitches, return to clinic as directed to have the stitches removed. You will continue wound care for 2-3 days after the stitches  are removed.   Wound healing continues for up to one year following surgery. It is not unusual to experience pain in the scar from time to time during the interval.  If the pain becomes severe or the scar thickens, you should notify the office.    A slight amount of redness in a scar is expected for the first six months.  After six months, the redness will fade and the scar will soften and fade.  The color difference becomes less noticeable with time.  If there are any problems, return for a post-op surgery check at your earliest convenience.  To improve the appearance of the scar, you can use silicone scar gel, cream, or sheets (such as Mederma or Serica) every night for up to one year. These are available over the counter (without a prescription).  Please call our office at [phone] for any questions or concerns.             If You Need Anything After Your Visit  If you have any questions or concerns for your doctor, please call our main line at [phone] and press option 4 to reach your doctor's medical assistant. If no one answers, please leave a voicemail as directed and we will return your call as soon as possible. Messages left after 4 pm will be answered the following business day.   You may also send  [place] a message via [place]. We typically respond to MyChart messages within 1-2 business days.  For prescription refills, please ask your pharmacy to contact our office. Our fax number is [phone].  If you have an urgent issue when the clinic is closed that cannot wait until the next business day, you can page your doctor at the number below.    Please note that while we do our best to be available for urgent issues outside of office hours, we are not available 24/7.   If you have an urgent issue and are unable to reach [place], you may choose to seek medical care at your doctor's office, retail clinic, urgent care center, or emergency room.  If you have a medical  emergency, please immediately call 911 or go to the emergency department.  Pager Numbers  - Dr. Nehemiah Massed: [phone]  - Dr. Laurence Ferrari: [phone]  - Dr. Nicole Kindred: [phone]  In the event of inclement weather, please call our main line at [phone] for an update on the status of any delays or closures.  Dermatology Medication Tips: Please keep the boxes that topical medications come in in order to help keep track of the instructions about where and how to use these. Pharmacies typically print the medication instructions only on the boxes and not directly on the medication tubes.   If your medication is too expensive, please contact our office at [phone] option 4 or send [place] a message through [place].   We are unable to tell what your co-pay for medications will be in advance as this is different depending on your insurance coverage. However, we may be able to find a substitute medication at lower cost or fill out paperwork to get insurance to cover a needed medication.   If a prior authorization is required to get your medication covered by your insurance company, please allow [place] 1-2 business days to complete this process.  Drug prices often vary depending on where the prescription is filled and some pharmacies may offer cheaper prices.  The website www.goodrx.com contains coupons for medications through different pharmacies. The prices here do not account for what the cost may be with help from insurance (it may be cheaper with your insurance), but the website can give you the price if you did not use any insurance.  - You can print the associated coupon and take it with your prescription to the pharmacy.  - You may also stop by our office during regular business hours and pick up a GoodRx coupon card.  - If you need your prescription sent electronically to a different pharmacy, notify our office through [facility] or by phone at [phone] option 4.     Si Usted  Necesita Algo Despus de Su Visita  Tambin puede enviarnos un mensaje a travs de Pharmacist, community. Por lo general respondemos a los mensajes de MyChart en el transcurso de 1 a 2 das hbiles.  Para renovar recetas, por favor pida a su farmacia que se ponga en contacto con nuestra oficina. Harland Dingwall de fax es [place] [phone].  Si tiene un asunto urgente cuando la clnica est cerrada y que no puede esperar hasta el siguiente da hbil, puede llamar/localizar a su doctor(a) al nmero que aparece a continuacin.   Por favor, tenga en cuenta que aunque hacemos todo lo posible para estar disponibles para asuntos urgentes fuera del horario de [place], no estamos disponibles las 24 horas del da, los 7 das de la [place].   Si tiene un  problema urgente y no puede comunicarse con nosotros, puede optar por buscar atencin mdica  en el consultorio de su doctor(a), en una clnica privada, en un centro de atencin urgente o en una sala de emergencias.  Si tiene Engineering geologist, por favor llame inmediatamente al 911 o vaya a la sala de emergencias.  Nmeros de bper  - Dr. Nehemiah Massed: [phone]  - Dra. Moye: [phone]  - Dra. Nicole Kindred: [phone]  En caso de inclemencias del [place], por favor llame a Johnsie Kindred principal al [phone] para una actualizacin sobre el [place] de cualquier retraso o cierre.  Consejos para la medicacin en dermatologa: Por favor, guarde las cajas en las que vienen los medicamentos de uso tpico para ayudarle a seguir las instrucciones sobre dnde y cmo usarlos. Las farmacias generalmente imprimen las instrucciones del medicamento slo en las cajas y no directamente en los tubos del [place].   Si su medicamento es muy caro, por favor, pngase en contacto con Zigmund Daniel llamando al [phone] y presione la opcin 4 o envenos un mensaje a travs de Pharmacist, community.   No podemos decirle cul ser su copago por los medicamentos por adelantado ya que esto es  diferente dependiendo de la cobertura de su seguro. Sin embargo, es posible que podamos encontrar un medicamento sustituto a Electrical engineer un formulario para que el seguro cubra el medicamento que se considera necesario.   Si se requiere una autorizacin previa para que su compaa de seguros [place] su medicamento, por favor permtanos de 1 a 2 das hbiles para completar este proceso.  Los precios de los medicamentos varan con frecuencia dependiendo del Environmental consultant de dnde se surte la receta y alguna farmacias pueden ofrecer precios ms baratos.  El sitio web www.goodrx.com tiene cupones para medicamentos de Airline pilot. Los precios aqu no tienen en cuenta lo que podra costar con la ayuda del seguro (puede ser ms barato con su seguro), pero el sitio web puede darle el precio si no utiliz Research scientist (physical sciences).  - Puede imprimir el cupn correspondiente y llevarlo con su receta a la farmacia.  - Tambin puede pasar por nuestra oficina durante el horario de atencin regular y Charity fundraiser una tarjeta de cupones de GoodRx.  - Si necesita que su receta se enve electrnicamente a una farmacia diferente, informe a nuestra oficina a travs de MyChart de [place] o por telfono llamando al [phone] y presione la opcin 4.

## 2020-12-14 ENCOUNTER — Encounter: Payer: Self-pay | Admitting: Dermatology

## 2021-07-26 IMAGING — RF DG HYSTEROGRAM
5 series · 5 of 5 positions shown · IV contrast (omnipaque)
Comparison: None.

CLINICAL DATA: Infertility

EXAM:
HYSTEROSALPINGOGRAM
TECHNIQUE: Following cleansing and placement of catheter by the ordering
physician, a hysterosalpingogram was performed using Omnipaque 300
contrast (50 mL). The patient tolerated the examination without
difficulty.

[Series 1: cp_standard · 0.26mm/px · 1 of 1 slices shown (1 of 5)]
[im 1/1]
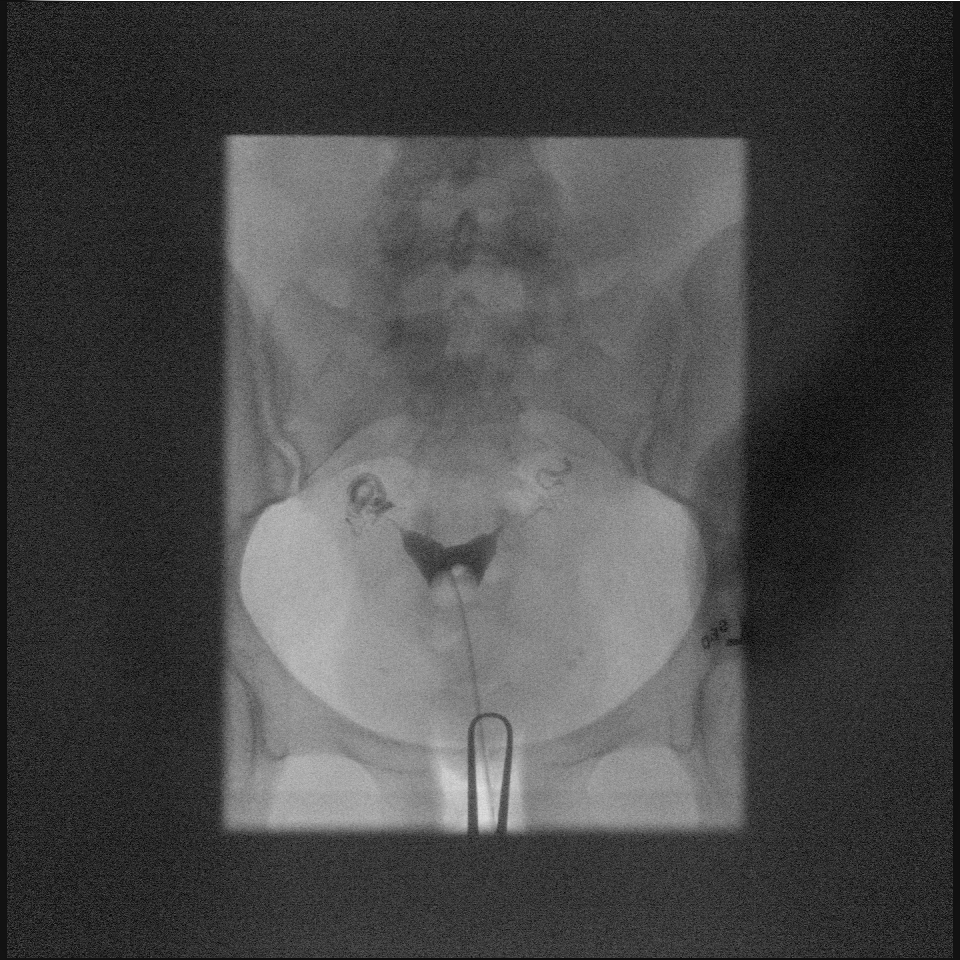

[Series 2: cp_standard · 0.26mm/px · 1 of 1 slices shown (2 of 5)]
[im 1/1]
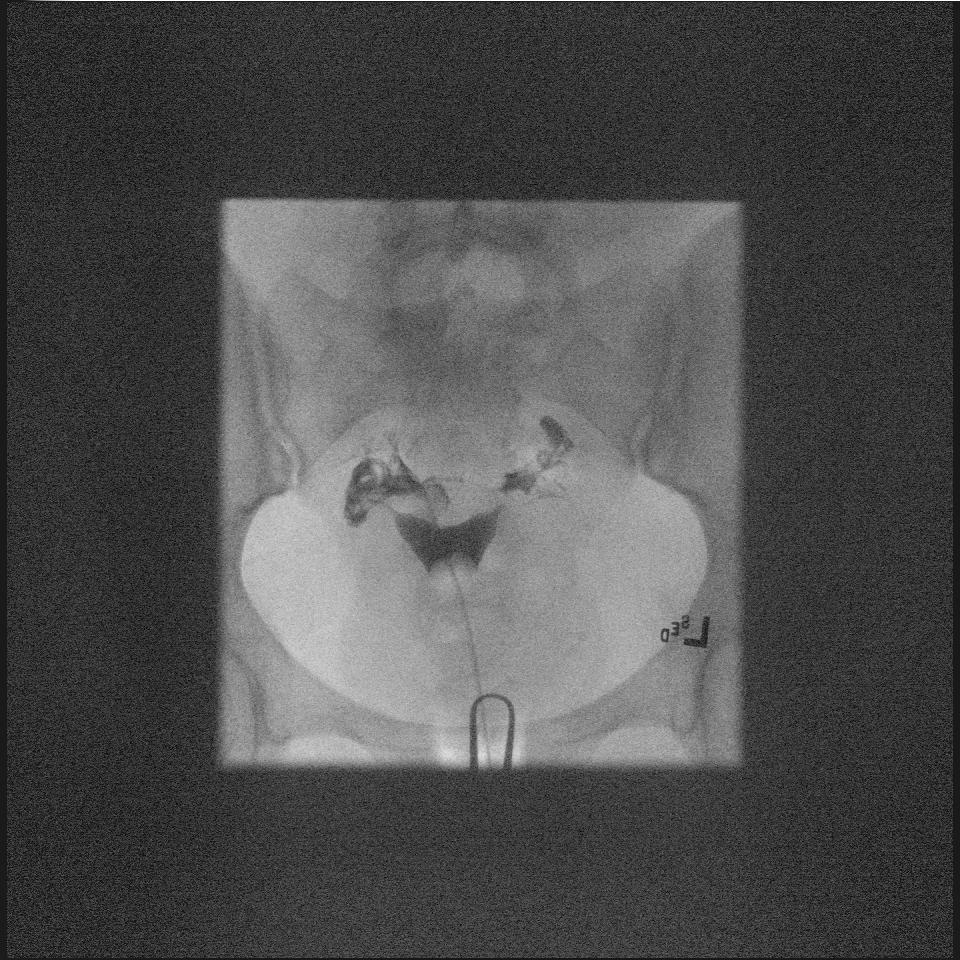

[Series 3: cp_standard · 0.26mm/px · 1 of 1 slices shown (3 of 5)]
[im 1/1]
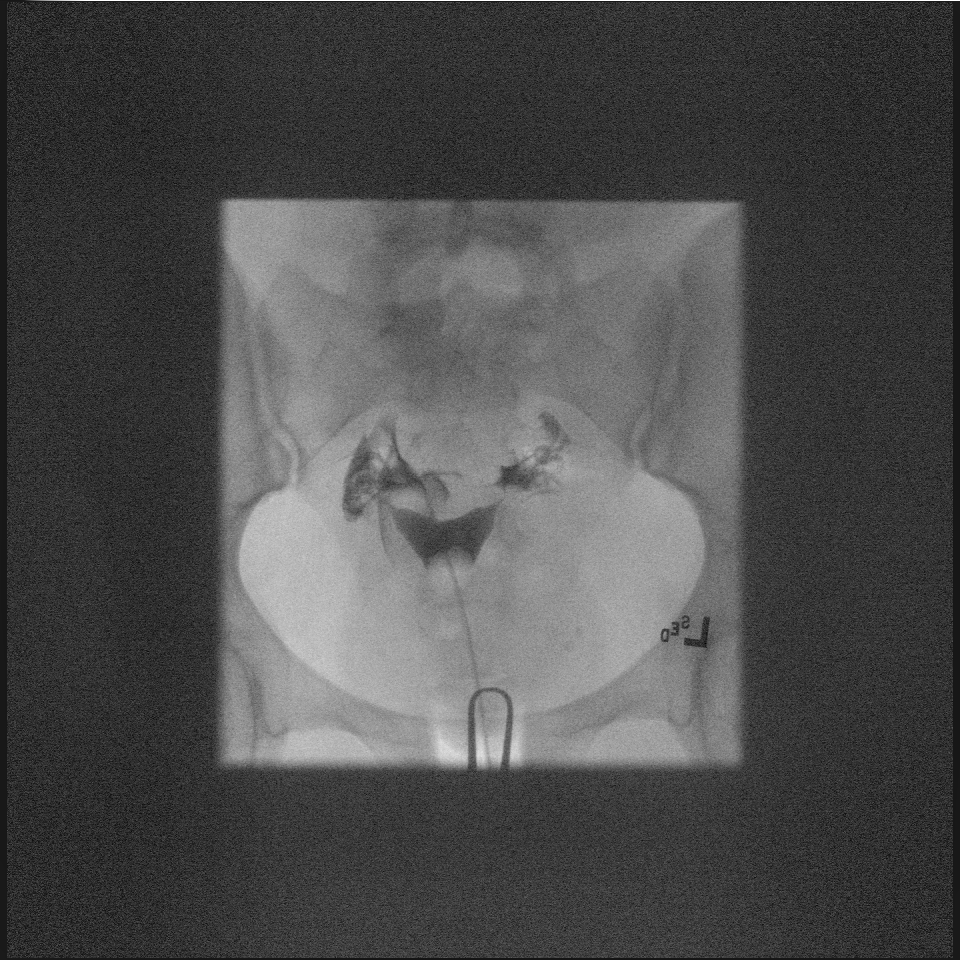

[Series 4: cp_standard · 0.26mm/px · 1 of 1 slices shown (4 of 5)]
[im 1/1]
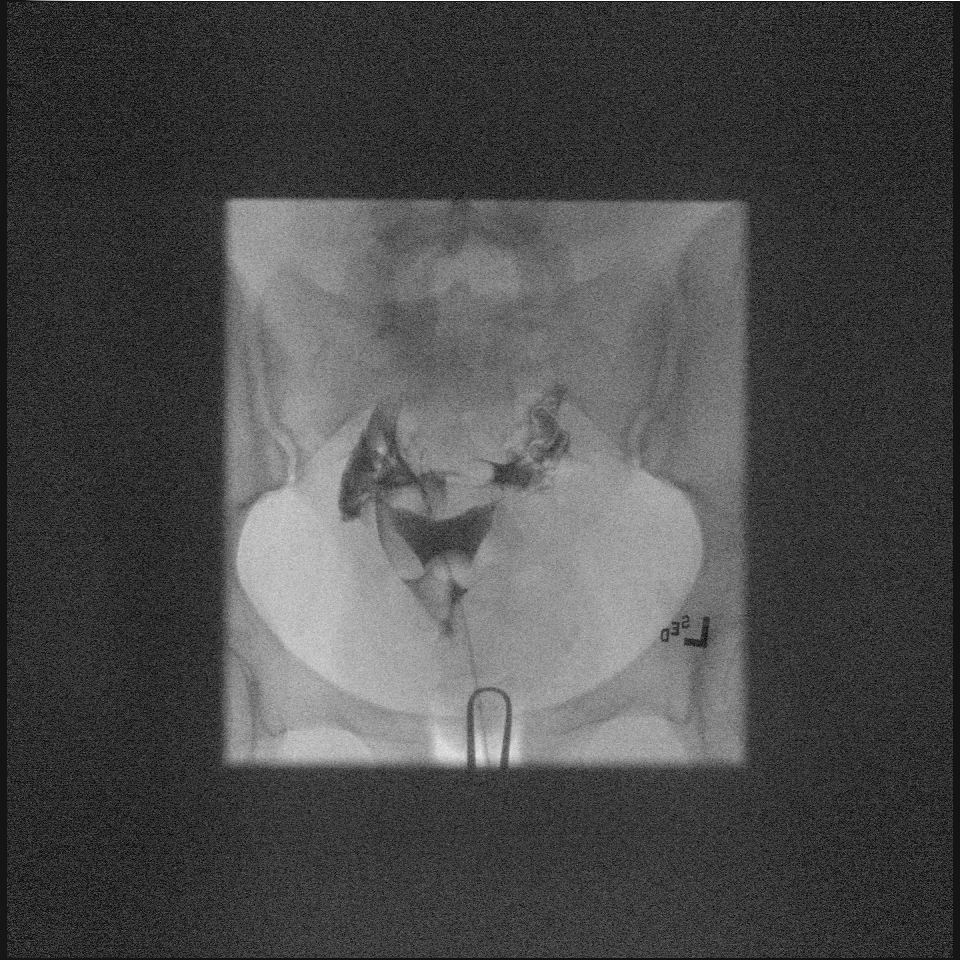

[Series 5: cp_standard · 0.26mm/px · 1 of 1 slices shown (5 of 5)]
[im 1/1]
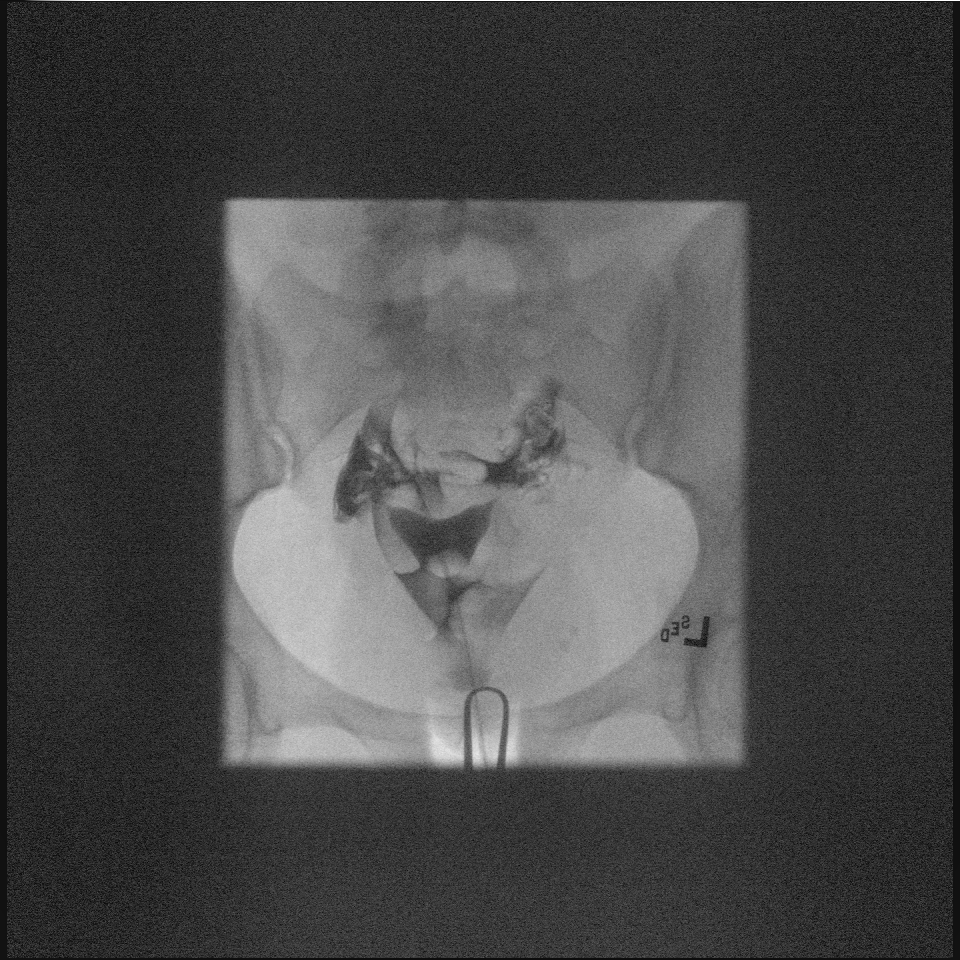

[5 of 5 positions shown; findings below may reference images not displayed]

FLUOROSCOPY TIME:  Radiation Exposure Index (as provided by the
fluoroscopic device): 1.2 mGy

If the device does not provide the exposure index:

Fluoroscopy Time:  18 seconds
FINDINGS: Filling of the endometrial canal and fallopian tubes with bilateral
free spill of contrast. No filling defect apart from balloon.
Visualized endometrial canal contours are unremarkable.
IMPRESSION: Patent fallopian tubes.

## 2021-08-01 ENCOUNTER — Ambulatory Visit (INDEPENDENT_AMBULATORY_CARE_PROVIDER_SITE_OTHER): Payer: 59

## 2021-08-01 VITALS — BP 124/72 | Ht 63.0 in | Wt 168.0 lb

## 2021-08-01 DIAGNOSIS — N912 Amenorrhea, unspecified: Secondary | ICD-10-CM

## 2021-08-01 LAB — POCT URINE PREGNANCY: Preg Test, Ur: POSITIVE — AB

## 2021-08-01 NOTE — Progress Notes (Signed)
Subjective:    Valerie Roberts is a 31 y.o. female who presents for evaluation of amenorrhea. She believes she could be pregnant. Pregnancy is desired.  Last period was normal.   Patient's last menstrual period was 06/10/2021 (exact date).    Lab Review Urine HCG: positive    Assessment:    Absence of menstruation.     Plan:    Pregnancy Test:  Positive: EDC: 03/17/2022. Briefly discussed positive results and sent to check out for scheduling for New OB appointments.

## 2021-08-08 ENCOUNTER — Ambulatory Visit (INDEPENDENT_AMBULATORY_CARE_PROVIDER_SITE_OTHER): Payer: 59

## 2021-08-08 VITALS — Wt 156.0 lb

## 2021-08-08 DIAGNOSIS — Z3A00 Weeks of gestation of pregnancy not specified: Secondary | ICD-10-CM

## 2021-08-08 DIAGNOSIS — Z3400 Encounter for supervision of normal first pregnancy, unspecified trimester: Secondary | ICD-10-CM

## 2021-08-08 DIAGNOSIS — Z3480 Encounter for supervision of other normal pregnancy, unspecified trimester: Secondary | ICD-10-CM

## 2021-08-08 DIAGNOSIS — Z369 Encounter for antenatal screening, unspecified: Secondary | ICD-10-CM

## 2021-08-08 DIAGNOSIS — Z3490 Encounter for supervision of normal pregnancy, unspecified, unspecified trimester: Secondary | ICD-10-CM | POA: Insufficient documentation

## 2021-08-08 NOTE — Progress Notes (Signed)
New OB Intake  I connected with  Phill Mutter on 08/08/21 at 10:15 AM EDT by telephone and verified that I am speaking with the correct person using two identifiers. Nurse is located at [employer] and pt is located at home.  I explained I am completing New OB Intake today. We discussed her EDD of 03/17/2022 that is based on LMP of 06/10/2021. Pt is G1/P0. I reviewed her allergies, medications, Medical/Surgical/OB history, and appropriate screenings. Based on history, this is a/an pregnancy uncomplicated .   Patient Active Problem List   Diagnosis Date Noted   Supervision of other normal pregnancy, antepartum 08/08/2021    Concerns addressed today None  Delivery Plans:  Plans to deliver at [facility].  Anatomy US Explained first scheduled [place] will be around 20 weeks. Dating scan isn't needed since LMP is exact.  Labs Discussed genetic screening with patient. Patient undecided about having  genetic testing. Discussed possible labs to be drawn at new OB appointment.  COVID Vaccine Patient has not had COVID vaccine.   Social Determinants of Health Food Insecurity: denies food insecurity Transportation: Patient denies transportation needs.  First visit review I reviewed new OB appt with pt. I explained she will have ob bloodwork and pap smear/pelvic exam if indicated. Explained pt will be seen by Roberto Scales, CNM at first visit; encounter routed to appropriate provider.   Cleophas Dunker, [facility] 08/08/2021  10:31 AM  Clinical Staff Provider  Office Location  [facility] Dating    Language  English Anatomy US    Flu Vaccine  offer Genetic Screen  NIPS:   TDaP vaccine   Offer (UTD) Hgb A1C or  GTT Early : Third trimester :   Covid declines   LAB RESULTS   Rhogam   Blood Type     Feeding Plan Breast Antibody    Contraception none Rubella    Circumcision undecided RPR     Pediatrician  undecided HBsAg     Support Person Tyler HIV    Prenatal Classes no  Varicella     GBS  (For PCN allergy, check sensitivities)   BTL Consent  Hep C   VBAC Consent  Pap      Hgb Electro      CF      SMA

## 2021-08-16 ENCOUNTER — Other Ambulatory Visit: Payer: 59

## 2021-08-16 DIAGNOSIS — Z3480 Encounter for supervision of other normal pregnancy, unspecified trimester: Secondary | ICD-10-CM

## 2021-08-17 LAB — CBC/D/PLT+RPR+RH+ABO+RUBIGG...
Antibody Screen: NEGATIVE
Basophils Absolute: 0 10*3/uL (ref 0.0–0.2)
Basos: 1 %
EOS (ABSOLUTE): 0 10*3/uL (ref 0.0–0.4)
Eos: 1 %
HCV Ab: NONREACTIVE
HIV Screen 4th Generation wRfx: NONREACTIVE
Hematocrit: 38.7 % (ref 34.0–46.6)
Hemoglobin: 13.2 g/dL (ref 11.1–15.9)
Hepatitis B Surface Ag: NEGATIVE
Immature Grans (Abs): 0 10*3/uL (ref 0.0–0.1)
Immature Granulocytes: 0 %
Lymphocytes Absolute: 1.3 10*3/uL (ref 0.7–3.1)
Lymphs: 20 %
MCH: 30 pg (ref 26.6–33.0)
MCHC: 34.1 g/dL (ref 31.5–35.7)
MCV: 88 fL (ref 79–97)
Monocytes Absolute: 0.2 10*3/uL (ref 0.1–0.9)
Monocytes: 3 %
Neutrophils Absolute: 4.9 10*3/uL (ref 1.4–7.0)
Neutrophils: 75 %
Platelets: 184 10*3/uL (ref 150–450)
RBC: 4.4 x10E6/uL (ref 3.77–5.28)
RDW: 13.7 % (ref 11.7–15.4)
RPR Ser Ql: NONREACTIVE
Rh Factor: POSITIVE
Rubella Antibodies, IGG: 3.55 index (ref >0.99)
Varicella zoster IgG: 940 index (ref >165)
WBC: 6.5 10*3/uL (ref 3.4–10.8)

## 2021-08-17 LAB — HCV INTERPRETATION

## 2021-08-25 ENCOUNTER — Ambulatory Visit (INDEPENDENT_AMBULATORY_CARE_PROVIDER_SITE_OTHER): Payer: 59 | Admitting: Licensed Practical Nurse

## 2021-08-25 ENCOUNTER — Other Ambulatory Visit (HOSPITAL_COMMUNITY)
Admission: RE | Admit: 2021-08-25 | Discharge: 2021-08-25 | Disposition: A | Discharge status: Home / Self Care | Payer: 59 | Source: Ambulatory Visit | Attending: Licensed Practical Nurse | Admitting: Licensed Practical Nurse

## 2021-08-25 VITALS — BP 122/70 | Wt 166.0 lb

## 2021-08-25 DIAGNOSIS — Z3401 Encounter for supervision of normal first pregnancy, first trimester: Secondary | ICD-10-CM | POA: Diagnosis not present

## 2021-08-25 DIAGNOSIS — Z113 Encounter for screening for infections with a predominantly sexual mode of transmission: Secondary | ICD-10-CM | POA: Diagnosis not present

## 2021-08-25 DIAGNOSIS — Z3400 Encounter for supervision of normal first pregnancy, unspecified trimester: Secondary | ICD-10-CM | POA: Insufficient documentation

## 2021-08-25 DIAGNOSIS — Z131 Encounter for screening for diabetes mellitus: Secondary | ICD-10-CM | POA: Diagnosis not present

## 2021-08-25 DIAGNOSIS — Z0283 Encounter for blood-alcohol and blood-drug test: Secondary | ICD-10-CM | POA: Diagnosis not present

## 2021-08-25 DIAGNOSIS — R110 Nausea: Secondary | ICD-10-CM

## 2021-08-25 DIAGNOSIS — Z3A10 10 weeks gestation of pregnancy: Secondary | ICD-10-CM

## 2021-08-25 DIAGNOSIS — Z3480 Encounter for supervision of other normal pregnancy, unspecified trimester: Secondary | ICD-10-CM

## 2021-08-25 MED ORDER — ONDANSETRON 4 MG PO TBDP: 4.0000 mg | ORAL_TABLET | Freq: Four times a day (QID) | ORAL | 3 refills | Status: AC | PRN

## 2021-08-25 NOTE — Progress Notes (Signed)
Routine Prenatal Care Visit  Subjective  Valerie Roberts is a 31 y.o. G1P0000 at 47w6dbeing seen today for ongoing prenatal care.  She is currently monitored for the following issues for this low-risk pregnancy and has Supervision of other normal pregnancy, antepartum on their problem list.  ----------------------------------------------------------------------------------- Patient reports nausea.  Desires prn medication for nausea, it doe snot happen daily but can be "bad", she does a lot of traveling and gets nauseas on planes. Excited to be pregnant Lives with Husband, feels safe Hx of depression was on Lexapro, feels fine now  Reviewed genetic screening, declines for now   . Vag. Bleeding: None.  Movement: Absent. Leaking Fluid denies.  ----------------------------------------------------------------------------------- The following portions of the patient's history were reviewed and updated as appropriate: allergies, current medications, past family history, past medical history, past social history, past surgical history and problem list. Problem list updated.  Objective  Blood pressure 122/70, weight 166 lb (75.3 kg), last menstrual period 06/10/2021. Pregravid weight 168 lb (76.2 kg) Total Weight Gain -2 lb (-0.907 kg) Urinalysis: Urine Protein    Urine Glucose    Fetal Status: Fetal Heart Rate (bpm): 163   Movement: Absent    Thyroid: not enlarged, no nodules Breasts: soft, no redness or masses, nipples slightly erect bilaterally  General:  Alert, oriented and cooperative. Patient is in no acute distress.  Skin: Skin is warm and dry. No rash noted.   Cardiovascular: Normal heart rate noted  Respiratory: Normal respiratory effort, no problems with respiration noted  Abdomen: Soft, gravid, appropriate for gestational age. Pain/Pressure: Absent     Pelvic:  Cervical exam deferred      cervix pink, no lesions. Uterus 10 wk sized, non tender   Extremities: Normal range of motion.   Edema: None  Mental Status: Normal mood and affect. Normal behavior. Normal judgment and thought content.   Assessment   31y.o. G1P0000 at 173w6dy  03/17/2022, by Last Menstrual Period presenting for routine prenatal visit  Plan   first Problems (from 08/08/21 to present)     No problems associated with this episode.        general obstetric precautions including but not limited to vaginal bleeding, contractions, leaking of fluid and fetal movement were reviewed in detail with the patient. Please refer to After Visit Summary for other counseling recommendations.   Return in about 4 weeks (around 09/22/2021) for ROB. Remainder of NOB labs collected   LyRoberto ScalesCNUintahCoBret Harteroup  08/25/21  12:03 PM

## 2021-08-25 NOTE — Progress Notes (Signed)
NOB today. Pt needing nausea medication.

## 2021-08-26 LAB — CERVICOVAGINAL ANCILLARY ONLY
Chlamydia: NEGATIVE
Comment: NEGATIVE
Comment: NORMAL
Neisseria Gonorrhea: NEGATIVE

## 2021-08-26 LAB — HEMOGLOBIN A1C
Est. average glucose Bld gHb Est-mCnc: 103 mg/dL
Hgb A1c MFr Bld: 5.2 % (ref 4.8–5.6)

## 2021-08-27 LAB — URINE DRUG PANEL 7
Amphetamines, Urine: NEGATIVE ng/mL
Barbiturate Quant, Ur: NEGATIVE ng/mL
Benzodiazepine Quant, Ur: NEGATIVE ng/mL
Cannabinoid Quant, Ur: NEGATIVE ng/mL
Cocaine (Metab.): NEGATIVE ng/mL
Opiate Quant, Ur: NEGATIVE ng/mL
PCP Quant, Ur: NEGATIVE ng/mL

## 2021-08-29 LAB — CULTURE, URINE COMPREHENSIVE

## 2021-08-30 ENCOUNTER — Other Ambulatory Visit: Payer: Self-pay | Admitting: Licensed Practical Nurse

## 2021-08-30 ENCOUNTER — Encounter: Payer: Self-pay | Admitting: Licensed Practical Nurse

## 2021-08-30 ENCOUNTER — Other Ambulatory Visit: Payer: Self-pay

## 2021-08-30 DIAGNOSIS — N390 Urinary tract infection, site not specified: Secondary | ICD-10-CM

## 2021-08-30 DIAGNOSIS — B3731 Acute candidiasis of vulva and vagina: Secondary | ICD-10-CM

## 2021-08-30 MED ORDER — AMOXICILLIN-POT CLAVULANATE 500-125 MG PO TABS: 1.0000 | ORAL_TABLET | Freq: Two times a day (BID) | ORAL | 0 refills | Status: AC

## 2021-08-30 NOTE — Addendum Note (Signed)
Addended by: Cleophas Dunker D on: 08/30/2021 02:56 PM   Modules accepted: Orders

## 2021-09-01 ENCOUNTER — Other Ambulatory Visit: Payer: 59

## 2021-09-01 DIAGNOSIS — Z3480 Encounter for supervision of other normal pregnancy, unspecified trimester: Secondary | ICD-10-CM

## 2021-09-01 DIAGNOSIS — Z369 Encounter for antenatal screening, unspecified: Secondary | ICD-10-CM

## 2021-09-05 MED ORDER — FLUCONAZOLE 150 MG PO TABS: 150.0000 mg | ORAL_TABLET | Freq: Once | ORAL | 0 refills | Status: AC

## 2021-09-07 LAB — MATERNIT 21 PLUS CORE, BLOOD
Fetal Fraction: 7
Result (T21): NEGATIVE
Trisomy 13 (Patau syndrome): NEGATIVE
Trisomy 18 (Edwards syndrome): NEGATIVE
Trisomy 21 (Down syndrome): NEGATIVE

## 2021-09-22 ENCOUNTER — Ambulatory Visit (INDEPENDENT_AMBULATORY_CARE_PROVIDER_SITE_OTHER): Payer: 59 | Admitting: Advanced Practice Midwife

## 2021-09-22 ENCOUNTER — Encounter: Payer: Self-pay | Admitting: Advanced Practice Midwife

## 2021-09-22 VITALS — BP 120/80 | Wt 162.0 lb

## 2021-09-22 DIAGNOSIS — Z369 Encounter for antenatal screening, unspecified: Secondary | ICD-10-CM | POA: Diagnosis not present

## 2021-09-22 DIAGNOSIS — Z3402 Encounter for supervision of normal first pregnancy, second trimester: Secondary | ICD-10-CM

## 2021-09-22 DIAGNOSIS — Z3A14 14 weeks gestation of pregnancy: Secondary | ICD-10-CM

## 2021-09-22 LAB — POCT URINALYSIS DIPSTICK OB: Glucose, UA: NEGATIVE

## 2021-09-22 NOTE — Progress Notes (Signed)
Routine Prenatal Care Visit  Subjective  Valerie Roberts is a 30 y.o. G1P0000 at 51w6dbeing seen today for ongoing prenatal care.  She is currently monitored for the following issues for this low-risk pregnancy and has Supervision of normal pregnancy on their problem list.  ----------------------------------------------------------------------------------- Patient reports no complaints.  Her nausea has improved. Self reported "picky eater". Discussed healthy pregnancy diet 3 meals plus healthy snacks for extra pregnancy calories.  . Vag. Bleeding: None.  Movement: Absent. Leaking Fluid denies.  ----------------------------------------------------------------------------------- The following portions of the patient's history were reviewed and updated as appropriate: allergies, current medications, past family history, past medical history, past social history, past surgical history and problem list. Problem list updated.  Objective  Blood pressure 120/80, weight 162 lb (73.5 kg), last menstrual period 06/10/2021. Pregravid weight 168 lb (76.2 kg) Total Weight Gain -6 lb (-2.722 kg) Urinalysis: Urine Protein    Urine Glucose    Fetal Status: Fetal Heart Rate (bpm): 143   Movement: Absent     General:  Alert, oriented and cooperative. Patient is in no acute distress.  Skin: Skin is warm and dry. No rash noted.   Cardiovascular: Normal heart rate noted  Respiratory: Normal respiratory effort, no problems with respiration noted  Abdomen: Soft, gravid, appropriate for gestational age. Pain/Pressure: Absent     Pelvic:  Cervical exam deferred        Extremities: Normal range of motion.  Edema: None  Mental Status: Normal mood and affect. Normal behavior. Normal judgment and thought content.   Assessment   31y.o. G1P0000 at 186w6dy  03/17/2022, by Last Menstrual Period presenting for routine prenatal visit  Plan   first Problems (from 08/08/21 to present)    Problem Noted Resolved    Supervision of normal pregnancy 08/08/2021 by JoCleophas DunkerCMA No   Overview Signed 08/25/2021 12:05 PM by DoAllen DerryCNReubenstaff Provider  Office Location  [facility] Dating  LMP  Language  English Anatomy [place]  Flu Vaccine   Genetic Screen  NIPS:   TDaP vaccine    Hgb A1C or  GTT Early : Third trimester :   Covid    LAB RESULTS   Rhogam  NA Blood Type O/Positive/-- (08/01 0908)   Feeding Plan Breast  Antibody Negative (08/01 0908)  Contraception  Rubella 3.55 (08/01 0908)  Circumcision  RPR Non Reactive (08/01 0908)   Pediatrician   HBsAg Negative (08/01 0908)   Support Person  HIV Non Reactive (08/01 0908)  Prenatal Classes  Varicella     GBS  (For PCN allergy, check sensitivities)   BTL Consent     VBAC Consent  Pap 3//08/2019 NILM    Hgb Electro    Pelvis Tested  CF      SMA                   Preterm labor symptoms and general obstetric precautions including but not limited to vaginal bleeding, contractions, leaking of fluid and fetal movement were reviewed in detail with the patient. Please refer to After Visit Summary for other counseling recommendations.   Return in about 4 weeks (around 10/20/2021) for anatomy scan and rob after.  JaRod CanCNM 09/22/2021 1:58 PM

## 2021-10-17 ENCOUNTER — Ambulatory Visit
Admission: RE | Admit: 2021-10-17 | Discharge: 2021-10-17 | Disposition: A | Discharge status: Home / Self Care | Payer: 59 | Source: Ambulatory Visit | Attending: Advanced Practice Midwife | Admitting: Advanced Practice Midwife

## 2021-10-17 DIAGNOSIS — Z3A18 18 weeks gestation of pregnancy: Secondary | ICD-10-CM | POA: Diagnosis not present

## 2021-10-17 DIAGNOSIS — Z3402 Encounter for supervision of normal first pregnancy, second trimester: Secondary | ICD-10-CM

## 2021-10-17 DIAGNOSIS — Z3689 Encounter for other specified antenatal screening: Secondary | ICD-10-CM | POA: Insufficient documentation

## 2021-10-17 DIAGNOSIS — Z369 Encounter for antenatal screening, unspecified: Secondary | ICD-10-CM
